# Patient Record
Sex: Male | Born: 1970
Health system: Southern US, Community
[De-identification: ages and names within clinical notes are randomized; demographics above are authoritative.]

## PROBLEM LIST (undated history)

## (undated) DIAGNOSIS — K900 Celiac disease: Secondary | ICD-10-CM

## (undated) DIAGNOSIS — E039 Hypothyroidism, unspecified: Secondary | ICD-10-CM

## (undated) DIAGNOSIS — E785 Hyperlipidemia, unspecified: Secondary | ICD-10-CM

## (undated) DIAGNOSIS — L130 Dermatitis herpetiformis: Secondary | ICD-10-CM

## (undated) DIAGNOSIS — J309 Allergic rhinitis, unspecified: Secondary | ICD-10-CM

## (undated) DIAGNOSIS — B351 Tinea unguium: Secondary | ICD-10-CM

## (undated) HISTORY — DX: Hypothyroidism, unspecified: E03.9

## (undated) HISTORY — DX: Allergic rhinitis, unspecified: J30.9

## (undated) HISTORY — DX: Hyperlipidemia, unspecified: E78.5

## (undated) HISTORY — DX: Tinea unguium: B35.1

## (undated) HISTORY — DX: Celiac disease: K90.0

## (undated) HISTORY — DX: Dermatitis herpetiformis: L13.0

---

## 2008-06-08 LAB — CONVERTED CEMR LAB
Albumin: 4.4 g/dL
BUN: 14 mg/dL
Calcium: 8.9 mg/dL
Chloride: 103 meq/L
Creatinine, Ser: 0.99 mg/dL
HCT: 47.7 %
Hemoglobin: 16.8 g/dL
Potassium: 4.1 meq/L
RDW: 12.2 %
Total Protein: 7.2 g/dL

## 2008-11-30 ENCOUNTER — Ambulatory Visit: Payer: Self-pay | Admitting: Internal Medicine

## 2008-11-30 DIAGNOSIS — J309 Allergic rhinitis, unspecified: Secondary | ICD-10-CM | POA: Insufficient documentation

## 2008-11-30 DIAGNOSIS — E785 Hyperlipidemia, unspecified: Secondary | ICD-10-CM

## 2008-11-30 DIAGNOSIS — E039 Hypothyroidism, unspecified: Secondary | ICD-10-CM | POA: Insufficient documentation

## 2008-11-30 DIAGNOSIS — L130 Dermatitis herpetiformis: Secondary | ICD-10-CM

## 2008-11-30 HISTORY — DX: Allergic rhinitis, unspecified: J30.9

## 2008-11-30 HISTORY — DX: Hyperlipidemia, unspecified: E78.5

## 2008-11-30 HISTORY — DX: Hypothyroidism, unspecified: E03.9

## 2008-11-30 HISTORY — DX: Dermatitis herpetiformis: L13.0

## 2008-11-30 LAB — CONVERTED CEMR LAB
Cholesterol: 214 mg/dL — ABNORMAL HIGH (ref 0–200)
TSH: 0.29 microintl units/mL — ABNORMAL LOW (ref 0.35–5.50)
Total CHOL/HDL Ratio: 5
Triglycerides: 188 mg/dL — ABNORMAL HIGH (ref 0.0–149.0)
VLDL: 37.6 mg/dL (ref 0.0–40.0)

## 2008-12-03 LAB — CONVERTED CEMR LAB: Chlamydia, Swab/Urine, PCR: NEGATIVE

## 2009-05-29 ENCOUNTER — Ambulatory Visit: Payer: Self-pay | Admitting: Internal Medicine

## 2009-05-30 LAB — CONVERTED CEMR LAB: TSH: 8.28 microintl units/mL — ABNORMAL HIGH (ref 0.35–5.50)

## 2009-09-25 ENCOUNTER — Ambulatory Visit: Payer: Self-pay | Admitting: Internal Medicine

## 2009-09-25 DIAGNOSIS — B351 Tinea unguium: Secondary | ICD-10-CM

## 2009-09-25 HISTORY — DX: Tinea unguium: B35.1

## 2009-09-25 LAB — CONVERTED CEMR LAB: TSH: 2.78 microintl units/mL (ref 0.35–5.50)

## 2009-10-31 ENCOUNTER — Telehealth: Payer: Self-pay | Admitting: Internal Medicine

## 2009-10-31 ENCOUNTER — Ambulatory Visit: Payer: Self-pay | Admitting: Internal Medicine

## 2009-10-31 LAB — CONVERTED CEMR LAB: TSH: 3.94 microintl units/mL (ref 0.35–5.50)

## 2010-01-24 ENCOUNTER — Ambulatory Visit: Payer: Self-pay | Admitting: Internal Medicine

## 2010-01-25 LAB — CONVERTED CEMR LAB
Albumin: 4.3 g/dL (ref 3.5–5.2)
Basophils Absolute: 0 10*3/uL (ref 0.0–0.1)
Basophils Relative: 0.4 % (ref 0.0–3.0)
Bilirubin Urine: NEGATIVE
Chloride: 107 meq/L (ref 96–112)
Cholesterol: 207 mg/dL — ABNORMAL HIGH (ref 0–200)
Creatinine, Ser: 0.9 mg/dL (ref 0.4–1.5)
Eosinophils Relative: 2.3 % (ref 0.0–5.0)
GFR calc non Af Amer: 101.9 mL/min (ref >60)
HDL: 46 mg/dL (ref >39.00)
Hemoglobin, Urine: NEGATIVE
Lymphocytes Relative: 30.8 % (ref 12.0–46.0)
MCV: 89 fL (ref 78.0–100.0)
Monocytes Absolute: 0.5 10*3/uL (ref 0.1–1.0)
Monocytes Relative: 9.4 % (ref 3.0–12.0)
Neutrophils Relative %: 57.1 % (ref 43.0–77.0)
PSA: 0.71 ng/mL (ref 0.10–4.00)
Platelets: 170 10*3/uL (ref 150.0–400.0)
RBC: 5.23 M/uL (ref 4.22–5.81)
TSH: 2.26 microintl units/mL (ref 0.35–5.50)
Total Protein, Urine: NEGATIVE mg/dL
Triglycerides: 175 mg/dL — ABNORMAL HIGH (ref 0.0–149.0)
Urine Glucose: NEGATIVE mg/dL
WBC: 5.4 10*3/uL (ref 4.5–10.5)

## 2010-01-31 ENCOUNTER — Ambulatory Visit: Payer: Self-pay | Admitting: Internal Medicine

## 2010-01-31 ENCOUNTER — Encounter: Payer: Self-pay | Admitting: Internal Medicine

## 2010-04-16 NOTE — Progress Notes (Signed)
Summary: TSH check?  Phone Note Call from Patient Call back at Home Phone [phone]   Caller: Patient Call For: Rowe Clack MD Summary of Call: pt feels excessively tired > 2 weeks, concerned not at right dose thyroid replacment - requests TSH check - Lucy, please enter order for TSH into [facility] (ICD9: 244.9) - thanks - i will then call pt with results once reviewed - Initial call taken by: Rowe Clack MD,  October 31, 2009 8:08 AM  Follow-up for Phone Call        Labs entered in [facility] Follow-up by: Tomma Lightning RMA,  October 31, 2009 8:16 AM  Additional Follow-up for Phone Call Additional follow up Details #1::        i notified pt - TSH norm Additional Follow-up by: Rowe Clack MD,  October 31, 2009 4:26 PM

## 2010-04-16 NOTE — Assessment & Plan Note (Signed)
Summary: 4-6 MTH FU  #  STC   Vital Signs:  Patient profile:   40 year old male Height:      70 inches (177.80 cm) Weight:      198.75 pounds (90.34 kg) BMI:     28.62 O2 Sat:      97 % on Room air Temp:     97.4 degrees F (36.33 degrees C) oral Pulse rate:   83 / minute BP sitting:   98 / 58  (left arm) Cuff size:   large  Vitals Entered By: Crissie Sickles, CMA (May 29, 2009 8:44 AM)  O2 Flow:  Room air CC: 4-6 mth f/u   Primary Care Provider:  Rowe Clack MD  CC:  4-6 mth f/u.  History of Present Illness: here for follow up thyroid  Allergies: No Known Drug Allergies  Past History:  Past Medical History: Reviewed history from 11/30/2008 and no changes required. hypothyroidism dyslipidemia  Review of Systems  The patient denies anorexia, fever, chest pain, and abdominal pain.    Physical Exam  General:  alert, well-developed, well-nourished, and cooperative to examination.    Neck:  supple, full ROM, no masses, no thyromegaly; no thyroid nodules or tenderness. no JVD or carotid bruits.   Lungs:  normal respiratory effort, no intercostal retractions or use of accessory muscles; normal breath sounds bilaterally - no crackles and no wheezes.    Heart:  normal rate, regular rhythm, no murmur, and no rub. BLE without edema. normal DP pulses and normal cap refill in all 4 extremities      Impression & Recommendations:  Problem # 1:  HYPOTHYROIDISM (ICD-244.9) has been off replacement meds 6 mos - will recheck now - further mgmt depending on labs - Labs Reviewed: TSH: 0.29 (11/30/2008)    Chol: 214 (11/30/2008)   HDL: 39.90 (11/30/2008)   LDL: 57 (06/08/2008)   TG: 188.0 (11/30/2008)  Orders: TLB-TSH (Thyroid Stimulating Hormone) (84443-TSH)  Complete Medication List: 1)  Dapsone 100 Mg Tabs (Dapsone) .... Take 1-3 by mouth once daily as needed 2)  Nasonex 50 Mcg/act Susp (Mometasone furoate) .Marland Kitchen.. 1 spray each nostril  every  morning  Patient Instructions: 1)  it was good to see you today.  2)  recheck TSH today - will call you with these results - 3)  further mgmt to depend on this results.Marland KitchenMarland Kitchen 4)  Please schedule a follow-up appointment annually, sooner if problems.

## 2010-04-16 NOTE — Assessment & Plan Note (Signed)
Summary: FU / NWS  #   Vital Signs:  Patient profile:   40 year old male Height:      70 inches (177.80 cm) Weight:      194.4 pounds (88.36 kg) O2 Sat:      97 % on Room air Temp:     98.3 degrees F (36.83 degrees C) oral Pulse rate:   88 / minute BP sitting:   112 / 70  (left arm) Cuff size:   large  Vitals Entered By: Tomma Lightning (September 25, 2009 8:51 AM)  O2 Flow:  Room air CC: follow-up visit Is Patient Diabetic? No Pain Assessment Patient in pain? no      Comments Requesting refills on synthroid   Primary Care Provider:  Rowe Clack MD  CC:  follow-up visit.  History of Present Illness: here for follow up thyroid  Clinical Review Panels:  Lipid Management   Cholesterol:  214 (11/30/2008)   LDL (bad choesterol):  128 (06/08/2008)   HDL (good cholesterol):  39.90 (11/30/2008)   Triglycerides:  285 (06/08/2008)  CBC   WBC:  6.5 (06/08/2008)   RBC:  5.38 (06/08/2008)   Hgb:  16.8 (06/08/2008)   Hct:  47.7 (06/08/2008)   Platelets:  172 (06/08/2008)   MCV  88.7 (06/08/2008)   RDW  12.2 (06/08/2008)  Complete Metabolic Panel   Glucose:  115 (06/08/2008)   Sodium:  137 (06/08/2008)   Potassium:  4.1 (06/08/2008)   Chloride:  103 (06/08/2008)   CO2:  26 (06/08/2008)   BUN:  14 (06/08/2008)   Creatinine:  0.99 (06/08/2008)   Albumin:  4.4 (06/08/2008)   Total Protein:  7.2 (06/08/2008)   Calcium:  8.9 (06/08/2008)   Total Bili:  0.8 (06/08/2008)   Alk Phos:  53 (06/08/2008)   SGPT (ALT):  43 (06/08/2008)   SGOT (AST):  27 (06/08/2008)   Current Medications (verified): 1)  Dapsone 100 Mg Tabs (Dapsone) .... Take 1-3 By Mouth Once Daily As Needed 2)  Nasonex 50 Mcg/act Susp (Mometasone Furoate) .Marland Kitchen.. 1 Spray Each Nostril  Every Morning 3)  Synthroid 50 Mcg Tabs (Levothyroxine Sodium) .Marland Kitchen.. 1 By Mouth Once Daily  Allergies (verified): No Known Drug Allergies  Past History:  Past Medical History: hypothyroidism dyslipidemia  Review of  Systems  The patient denies weight loss and abdominal pain.    Physical Exam  General:  alert, well-developed, well-nourished, and cooperative to examination.    Neck:  supple, full ROM, no masses, no thyromegaly; no thyroid nodules or tenderness. no JVD or carotid bruits.   Lungs:  normal respiratory effort, no intercostal retractions or use of accessory muscles; normal breath sounds bilaterally - no crackles and no wheezes.    Heart:  normal rate, regular rhythm, no murmur, and no rub. BLE without edema.  Skin:  4th/5th tonbails left foot with mild onco, nail lifting changes, no thickening Psych:  Oriented X3, memory intact for recent and remote, normally interactive, good eye contact, not anxious appearing, not depressed appearing, and not agitated.      Impression & Recommendations:  Problem # 1:  HYPOTHYROIDISM (ICD-244.9)  His updated medication list for this problem includes:    Synthroid 50 Mcg Tabs (Levothyroxine sodium) .Marland Kitchen... 1 by mouth once daily  Labs Reviewed: TSH: 8.28 (05/29/2009)    Chol: 214 (11/30/2008)   HDL: 39.90 (11/30/2008)   LDL: 57 (06/08/2008)   TG: 188.0 (11/30/2008)  Orders: TLB-TSH (Thyroid Stimulating Hormone) (84443-TSH)  Problem # 2:  ONYCHOMYCOSIS,  TOENAILS (ICD-110.1) rec herbal > systemic tx at this time - consider pennlac as needed   Complete Medication List: 1)  Dapsone 100 Mg Tabs (Dapsone) .... Take 1-3 by mouth once daily as needed 2)  Nasonex 50 Mcg/act Susp (Mometasone furoate) .Marland Kitchen.. 1 spray each nostril  every morning 3)  Synthroid 50 Mcg Tabs (Levothyroxine sodium) .Marland Kitchen.. 1 by mouth once daily  Patient Instructions: 1)  it was good to see you today.  2)  recheck TSH today - will call you with these results - 3)  further mgmt to depend on this results.Marland KitchenMarland Kitchen 4)  Please schedule a follow-up appointment sept-oct for physical and labs, sooner if problems.  Prescriptions: SYNTHROID 50 MCG TABS (LEVOTHYROXINE SODIUM) 1 by mouth once daily  #30  x 6   Entered by:   Tomma Lightning   Authorized by:   Rowe Clack MD   Signed by:   Tomma Lightning on 09/25/2009   Method used:   Electronically to        [facility] (retail)       [address]       [place], [place]  [address]       Ph: [phone]       Fax: [phone]   [place]:   [phone]

## 2010-04-16 NOTE — Assessment & Plan Note (Signed)
Summary: cpx/#/cd   Vital Signs:  Patient profile:   40 year old male Height:      70 inches (177.80 cm) Weight:      195.0 pounds (88.64 kg) O2 Sat:      97 % on Room air Temp:     98.3 degrees F (36.83 degrees C) oral Pulse rate:   67 / minute BP sitting:   110 / 82  (left arm) Cuff size:   large  Vitals Entered By: Tomma Lightning RMA (January 31, 2010 8:12 AM)  O2 Flow:  Room air CC: CPX Is Patient Diabetic? No Pain Assessment Patient in pain? no        Primary Care Provider:  Rowe Clack MD  CC:  CPX.  History of Present Illness: patient is here today for annual physical. Patient feels well and has no complaints.   reviewed other med issues: allg rhinitis - inc sinus and right ear fullness with weather change using nasonex w/o complete relief of symptoms  no fever or HA  hypothyroid - reports compliance with ongoing medical treatment and no changes in medication dose or frequency. denies adverse side effects related to current therapy.   Preventive Screening-Counseling & Management  Alcohol-Tobacco     Alcohol drinks/day: <1     Alcohol Counseling: not indicated; use of alcohol is not excessive or problematic     Smoking Status: never     Tobacco Counseling: not indicated; no tobacco use  Caffeine-Diet-Exercise     Nutrition Referrals: no     Does Patient Exercise: yes     Exercise Counseling: not indicated; exercise is adequate     Depression Counseling: not indicated; screening negative for depression  Safety-Violence-Falls     Seat Belt Counseling: not indicated; patient wears seat belts     Helmet Counseling: not indicated; patient wears helmet when riding bicycle/motocycle     Firearm Counseling: not applicable     Smoke Detector Counseling: no     Violence Counseling: not indicated; no violence risk noted     Fall Risk Counseling: not indicated; no significant falls noted  Clinical Review Panels:  Prevention   Last PSA:  0.71  (01/24/2010)  Immunizations   Last Tetanus Booster:  Tdap (11/30/2008)  Lipid Management   Cholesterol:  207 (01/24/2010)   LDL (bad choesterol):  128 (06/08/2008)   HDL (good cholesterol):  46.00 (01/24/2010)   Triglycerides:  285 (06/08/2008)  CBC   WBC:  5.4 (01/24/2010)   RBC:  5.23 (01/24/2010)   Hgb:  16.2 (01/24/2010)   Hct:  46.5 (01/24/2010)   Platelets:  170.0 (01/24/2010)   MCV  89.0 (01/24/2010)   MCHC  34.7 (01/24/2010)   RDW  12.6 (01/24/2010)   PMN:  57.1 (01/24/2010)   Lymphs:  30.8 (01/24/2010)   Monos:  9.4 (01/24/2010)   Eosinophils:  2.3 (01/24/2010)   Basophil:  0.4 (01/24/2010)  Complete Metabolic Panel   Glucose:  101 (01/24/2010)   Sodium:  141 (01/24/2010)   Potassium:  4.5 (01/24/2010)   Chloride:  107 (01/24/2010)   CO2:  26 (01/24/2010)   BUN:  15 (01/24/2010)   Creatinine:  0.9 (01/24/2010)   Albumin:  4.3 (01/24/2010)   Total Protein:  6.6 (01/24/2010)   Calcium:  9.2 (01/24/2010)   Total Bili:  1.0 (01/24/2010)   Alk Phos:  52 (01/24/2010)   SGPT (ALT):  37 (01/24/2010)   SGOT (AST):  21 (01/24/2010)   Current Medications (verified): 1)  Dapsone 100  Mg Tabs (Dapsone) .... Take 1-3 By Mouth Once Daily As Needed 2)  Nasonex 50 Mcg/act Susp (Mometasone Furoate) .Marland Kitchen.. 1 Spray Each Nostril  Every Morning 3)  Synthroid 50 Mcg Tabs (Levothyroxine Sodium) .Marland Kitchen.. 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 I have reviewed all other systems and they were negative.   Physical Exam  General:  alert, well-developed, well-nourished, and cooperative to examination.    Head:  Normocephalic and atraumatic without obvious abnormalities. No apparent alopecia or balding. Eyes:  vision grossly intact; pupils equal, round and reactive to light.  conjunctiva and lids normal.    Ears:  normal pinnae bilaterally, without erythema, swelling, or tenderness to palpation. TMs hazy, without effusion, or cerumen impaction. Hearing grossly normal bilaterally  Mouth:  teeth and gums in good repair; mucous membranes moist, without lesions or ulcers. oropharynx clear without exudate, no erythema.  Lungs:  normal respiratory effort, no intercostal retractions or use of accessory muscles; normal breath sounds bilaterally - no crackles and no wheezes.    Heart:  normal rate, regular rhythm, no murmur, and no rub. BLE without edema. normal DP pulses and normal cap refill in all 4 extremities    Abdomen:  soft, non-tender, normal bowel sounds, no distention; no masses and no appreciable hepatomegaly or splenomegaly.   Genitalia:  defer Msk:  No deformity or scoliosis noted of thoracic or lumbar spine.   Neurologic:  alert & oriented X3 and cranial nerves II-XII symetrically intact.  strength normal in all extremities, sensation intact to light touch, and gait normal. speech fluent without dysarthria or aphasia; follows commands with good comprehension.  Skin:  no rashes, vesicles, ulcers, or erythema. No nodules or irregularity to palpation.  Psych:  Oriented X3, memory intact for recent and remote, normally interactive, good eye contact, not anxious appearing, not depressed appearing, and not agitated.      Impression & Recommendations:  Problem # 1:  PREVENTIVE HEALTH CARE (ICD-V70.0) Patient has been counseled on age-appropriate routine health concerns for screening and  prevention. These are reviewed and up-to-date. Immunizations are up-to-date or declined. Labs and ECG reviewed.  Orders: EKG w/ Interpretation (93000)  Problem # 2:  HYPOTHYROIDISM (ICD-244.9)  His updated medication list for this problem includes:    Synthroid 50 Mcg Tabs (Levothyroxine sodium) .Marland Kitchen... 1 by mouth once daily  Labs Reviewed: TSH: 2.26 (01/24/2010)    Chol: 207 (01/24/2010)   HDL: 46.00 (01/24/2010)   LDL: 57 (06/08/2008)   TG: 175.0 (01/24/2010)  Problem # 3:  ALLERGIC RHINITIS CAUSE UNSPECIFIED (ICD-477.9)  His updated medication list for this problem includes:    Veramyst 27.5 Mcg/spray Susp (Fluticasone furoate) .Marland Kitchen... 1 spray each nostril  every morning  Discussed use of allergy medications and environmental measures.   Complete Medication List: 1)  Dapsone 100 Mg Tabs (Dapsone) .... Take 1-3 by mouth once daily as needed 2)  Veramyst 27.5 Mcg/spray Susp (Fluticasone furoate) .Marland Kitchen.. 1 spray  each nostril  every morning 3)  Synthroid 50 Mcg Tabs (Levothyroxine sodium) .Marland Kitchen.. 1 by mouth once daily  Patient Instructions: 1)  it was good to see you today.  2)  exam and labs and EKG look great 3)  change nasonex to veramyst - your prescription has been electronically submitted to your pharmacy. Please take as directed. Contact our office if you believe you're having problems with the medication(s).  4)  Please schedule a follow-up appointment in 6 months for TSH, call sooner if problems.  Prescriptions: VERAMYST 27.5 MCG/SPRAY SUSP (FLUTICASONE FUROATE) 1 spray each nostril  every morning  #1 x 3   Entered and Authorized by:   Rowe Clack MD   Signed by:   Rowe Clack MD on 01/31/2010   Method used:   Electronically to        [facility]       [address]       [place], [place]  [address]       Ph: [phone]       Fax: [phone]   [facility]:   [id number]    Orders Added: 1)  EKG w/ Interpretation  [93000] 2)  Est. Patient 40-64 years [50354]

## 2010-06-11 ENCOUNTER — Other Ambulatory Visit: Payer: Self-pay | Admitting: Internal Medicine

## 2010-09-14 ENCOUNTER — Encounter: Payer: Self-pay | Admitting: Internal Medicine

## 2011-01-07 ENCOUNTER — Other Ambulatory Visit: Payer: Self-pay | Admitting: Internal Medicine

## 2011-08-29 ENCOUNTER — Other Ambulatory Visit: Payer: Self-pay | Admitting: Internal Medicine

## 2011-09-01 MED ORDER — FLUTICASONE FUROATE 27.5 MCG/SPRAY NA SUSP: 1.0000 | NASAL | Status: DC

## 2011-11-23 ENCOUNTER — Other Ambulatory Visit: Payer: Self-pay | Admitting: Internal Medicine

## 2015-04-05 DIAGNOSIS — E039 Hypothyroidism, unspecified: Secondary | ICD-10-CM | POA: Diagnosis not present

## 2015-05-31 DIAGNOSIS — E039 Hypothyroidism, unspecified: Secondary | ICD-10-CM | POA: Diagnosis not present

## 2015-05-31 MED FILL — SYNTHROID 137 MCG TABLET: 137 | 90 days supply | Qty: 90 | Fill #0

## 2015-09-06 MED FILL — SYNTHROID 137 MCG TABLET: 137 | 90 days supply | Qty: 90 | Fill #0

## 2015-12-17 MED FILL — SYNTHROID 137 MCG TABLET: 137 | 90 days supply | Qty: 90 | Fill #1

## 2016-03-18 MED FILL — SYNTHROID 137 MCG TABLET: 137 | 90 days supply | Qty: 90 | Fill #2

## 2016-06-25 MED FILL — SYNTHROID 137 MCG TABLET: 137 | 90 days supply | Qty: 90 | Fill #0

## 2016-08-07 DIAGNOSIS — L130 Dermatitis herpetiformis: Secondary | ICD-10-CM | POA: Diagnosis not present

## 2016-08-07 DIAGNOSIS — E039 Hypothyroidism, unspecified: Secondary | ICD-10-CM | POA: Diagnosis not present

## 2016-08-07 DIAGNOSIS — Z6828 Body mass index (BMI) 28.0-28.9, adult: Secondary | ICD-10-CM | POA: Diagnosis not present

## 2016-08-07 DIAGNOSIS — E663 Overweight: Secondary | ICD-10-CM | POA: Diagnosis not present

## 2016-09-25 MED FILL — SYNTHROID 137 MCG TABLET: 137 | 90 days supply | Qty: 90 | Fill #1

## 2016-12-31 MED FILL — SYNTHROID 137 MCG TABLET: 137 | 90 days supply | Qty: 90 | Fill #0

## 2017-04-16 MED FILL — SYNTHROID 137 MCG TABLET: 137 | 90 days supply | Qty: 90 | Fill #1

## 2017-06-14 ENCOUNTER — Encounter (HOSPITAL_BASED_OUTPATIENT_CLINIC_OR_DEPARTMENT_OTHER): Payer: Self-pay | Admitting: Respiratory Therapy

## 2017-06-14 ENCOUNTER — Emergency Department (HOSPITAL_BASED_OUTPATIENT_CLINIC_OR_DEPARTMENT_OTHER)
Admission: EM | Admit: 2017-06-14 | Discharge: 2017-06-14 | Disposition: A | Discharge status: Home / Self Care | Payer: 59 | Attending: Emergency Medicine | Admitting: Emergency Medicine

## 2017-06-14 ENCOUNTER — Other Ambulatory Visit: Payer: Self-pay

## 2017-06-14 ENCOUNTER — Emergency Department (HOSPITAL_BASED_OUTPATIENT_CLINIC_OR_DEPARTMENT_OTHER): Payer: 59

## 2017-06-14 DIAGNOSIS — R509 Fever, unspecified: Secondary | ICD-10-CM | POA: Insufficient documentation

## 2017-06-14 DIAGNOSIS — Z79899 Other long term (current) drug therapy: Secondary | ICD-10-CM | POA: Insufficient documentation

## 2017-06-14 DIAGNOSIS — J3489 Other specified disorders of nose and nasal sinuses: Secondary | ICD-10-CM | POA: Insufficient documentation

## 2017-06-14 DIAGNOSIS — R059 Cough, unspecified: Secondary | ICD-10-CM

## 2017-06-14 DIAGNOSIS — R05 Cough: Secondary | ICD-10-CM | POA: Diagnosis not present

## 2017-06-14 DIAGNOSIS — E039 Hypothyroidism, unspecified: Secondary | ICD-10-CM | POA: Diagnosis not present

## 2017-06-14 DIAGNOSIS — R0981 Nasal congestion: Secondary | ICD-10-CM | POA: Diagnosis not present

## 2017-06-14 NOTE — ED Notes (Signed)
ED Provider at bedside. 

## 2017-06-14 NOTE — ED Provider Notes (Signed)
[facility] EMERGENCY DEPARTMENT Provider Note   CSN: [id number] Arrival date & time: 06/14/17  1022     History   Chief Complaint Chief Complaint  Patient presents with  . Cough    HPI Chad Bennett is a 47 y.o. male.  The history is provided by the patient and medical records.  URI   This is a new problem. The current episode started more than 1 week ago. The problem has not changed since onset.The fever has been present for less than 1 day. Associated symptoms include congestion, rhinorrhea and cough. Pertinent negatives include no chest pain, no abdominal pain, no diarrhea, no nausea, no vomiting, no dysuria, no headaches, no sneezing, no neck pain, no rash and no wheezing. He has tried NSAIDs for the symptoms. The treatment provided no relief.    Past Medical History:  Diagnosis Date  . ALLERGIC RHINITIS CAUSE UNSPECIFIED 11/30/2008  . 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 DYSLIPIDEMIA 11/30/2008  . HYPOTHYROIDISM 11/30/2008  . ONYCHOMYCOSIS, TOENAILS 09/25/2009    Patient Active Problem List   Diagnosis Date Noted  . ONYCHOMYCOSIS, TOENAILS 09/25/2009  . HYPOTHYROIDISM 11/30/2008  . DYSLIPIDEMIA 11/30/2008  . ALLERGIC RHINITIS CAUSE UNSPECIFIED 11/30/2008  . 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 No pertinent surgical history.      Home Medications    Prior to Admission medications   Medication Sig Start Date End Date Taking? Authorizing Provider  dapsone 100 MG tablet Take 100 mg by mouth daily.      [provider]  fluticasone (VERAMYST) 27.5 MCG/SPRAY nasal spray Place 1 spray into the nose every morning. 09/01/11   Rowe Clack, MD  levothyroxine (SYNTHROID, LEVOTHROID) 50 MCG tablet TAKE ONE TABLET BY MOUTH ONE TIME DAILY 11/23/11   Rowe Clack, MD    Family History Family History  Problem Relation Age of Onset  . Hypertension Mother   . Hypertension Other        grandparent  . Diabetes Other          grandparent  . Hypertension Other        Hx CABG    Social History Social History   Tobacco Use  . Smoking status: Never Smoker  . Smokeless tobacco: Never Used  . Tobacco comment: Divorced- but in committed relationship since 2008  Substance Use Topics  . Alcohol use: Yes    Comment: occassional beer  . Drug use: No     Allergies   Patient has no known allergies.   Review of Systems Review of Systems  Constitutional: Positive for chills and fever. Negative for diaphoresis and fatigue.  HENT: Positive for congestion and rhinorrhea. Negative for sneezing.   Respiratory: Positive for cough. Negative for chest tightness, shortness of breath, wheezing and stridor.   Cardiovascular: Negative for chest pain and palpitations.  Gastrointestinal: Negative for abdominal pain, constipation, diarrhea, nausea and vomiting.  Genitourinary: Negative for dysuria, enuresis and flank pain.  Musculoskeletal: Positive for myalgias. Negative for back pain, neck pain and neck stiffness.  Skin: Negative for rash.  Neurological: Negative for light-headedness, numbness and headaches.  Psychiatric/Behavioral: Negative for agitation.  All other systems reviewed and are negative.    Physical Exam Updated Vital Signs BP 120/69 (BP Location: Left Arm)   Pulse (!) 105   Temp 99.9 F (37.7 C) (Oral)   Resp 20   Ht 5' 10"  (1.778 m)   Wt 90.7 kg (200 lb)   SpO2 97%   BMI 28.70  kg/m   Physical Exam  Constitutional: He appears well-developed and well-nourished. No distress.  HENT:  Head: Normocephalic.  Nose: Nose normal.  Mouth/Throat: Oropharynx is clear and moist. No oropharyngeal exudate.  Eyes: Conjunctivae are normal.  Neck: Normal range of motion. Neck supple.  Cardiovascular: Normal rate.  No murmur heard. Pulmonary/Chest: Effort normal and breath sounds normal. No stridor. No respiratory distress. He has no wheezes. He has no rales. He exhibits no tenderness.  Abdominal:  Soft. He exhibits no distension. There is no tenderness.  Musculoskeletal: He exhibits no edema or tenderness.  Neurological: He is alert.  Skin: Skin is warm. Capillary refill takes less than 2 seconds. He is not diaphoretic. No erythema. No pallor.  Psychiatric: He has a normal mood and affect.  Nursing note and vitals reviewed.    ED Treatments / Results  Labs (all labs ordered are listed, but only abnormal results are displayed) Labs Reviewed - No data to display  EKG None  Radiology Dg Chest 2 View  Result Date: 06/14/2017 CLINICAL DATA:  Cough, congestion and fevers. EXAM: CHEST - 2 VIEW COMPARISON:  None. FINDINGS: The heart size and mediastinal contours are within normal limits. Both lungs are clear. The visualized skeletal structures are unremarkable. IMPRESSION: No active cardiopulmonary disease. Electronically Signed   By: Kerby Moors M.D.   On: 06/14/2017 10:52    Procedures Procedures (including critical care time)  Medications Ordered in ED Medications - No data to display   Initial Impression / Assessment and Plan / ED Course  I have reviewed the triage vital signs and the nursing notes.  Pertinent labs & imaging results that were available during my care of the patient were reviewed by me and considered in my medical decision making (see chart for details).     Chad Bennett is a 47 y.o. male with no significant past medical history who presents with cough, rhinorrhea, and congestion for 1 week and a 2-day history of subjective fevers.  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  He reports over the last week he has had URI symptoms including rhinorrhea, congestion, cough.  He reports that he thought it was viral however it continued over the weekend and he developed a subjective fever last night.  On arrival, patient had an oral temp of 99.9, suspect real fever.   Patient reports no chest pain, palpitations, or abdominal pain.  He denies nausea, vomiting, conservation, diarrhea, or dysuria.  He overall appears well but does have some myalgias.  His primary concern today is to rule out pneumonia.  On exam, lungs are clear.  Chest is nontender.  Abdomen nontender.  Patient had no neck lymph nodes and no neck soreness.  Patient had  normal oxygen saturation on room air.  With the patient's known exposures to bacterial pneumonias and his symptoms continue for 1 week, decision made to obtain x-ray.    Chest x-ray shows no evidence of pneumonia.  Suspect viral infection continuing causing his symptoms.  Patient did not want to get a flu test as he is outside of window for Tamiflu and he agrees this is likely viral.  Patient will stay hydrated and follow-up with his PCP in several days.  Patient had improved vital signs while waiting in the emergency department and was feeling well.  Patient had no other questions or concerns and understood return precautions.  Patient discharged in good  condition.   Final Clinical Impressions(s) / ED Diagnoses   Final diagnoses:  Cough    ED Discharge Orders    None     Clinical Impression: 1. Cough     Disposition: Discharge  Condition: Good  I have discussed the results, Dx and Tx plan with the pt(& family if present). He/she/they expressed understanding and agree(s) with the plan. Discharge instructions discussed at great length. Strict return precautions discussed and pt &/or family have verbalized understanding of the instructions. No further questions at time of discharge.    New Prescriptions   No medications on file    Follow Up: [facility] [address] [id number] mc [address] [place] [phone]       Rey Dansby, Gwenyth Allegra, MD 06/14/17 [phone]

## 2017-06-14 NOTE — ED Triage Notes (Signed)
Cough x 1 week, states he is concerned for pneumonia. Subjective fever last night also.

## 2017-06-14 NOTE — ED Notes (Signed)
Patient transported to X-ray 

## 2017-06-14 NOTE — Discharge Instructions (Signed)
Your x-rays today did not show evidence of pneumonia.  Given your fevers, cough, congestion, and cold symptoms, we suspect you have a viral upper respiratory infection.  Please rest and stay hydrated.  Please follow-up with your primary care physician.  If any symptoms change or worsen, please return to the nearest emergency department for reevaluation.

## 2017-08-04 MED FILL — SYNTHROID 137 MCG TABLET: 137 | 90 days supply | Qty: 90 | Fill #0

## 2017-10-12 ENCOUNTER — Telehealth: Payer: Self-pay | Admitting: Cardiology

## 2017-10-12 ENCOUNTER — Other Ambulatory Visit: Payer: Self-pay | Admitting: *Deleted

## 2017-10-12 DIAGNOSIS — R0602 Shortness of breath: Secondary | ICD-10-CM

## 2017-10-12 NOTE — Telephone Encounter (Signed)
-----   Message from Minus Breeding, MD sent at 10/12/2017 11:08 AM EDT ----- Can you order a calcium score on Dr. Thereasa Solo and call him with instructions.  [phone]).  SOB.   Thanks.

## 2017-10-12 NOTE — Telephone Encounter (Signed)
Patient has been scheduled for test on 10/21/17. Scheduled by Freddi Starr.

## 2017-10-12 NOTE — Telephone Encounter (Signed)
Coronary calcium score test has been ordered.  This test is done at [address]. This is $150 out of pocket  LMTCB  Coronary CalciumScan A coronary calcium scan is an imaging test used to look for deposits of calcium and other fatty materials (plaques) in the inner lining of the blood vessels of the heart (coronary arteries). These deposits of calcium and plaques can partly clog and narrow the coronary arteries without producing any symptoms or warning signs. This puts a person at risk for a heart attack. This test can detect these deposits before symptoms develop. Tell a health care provider about:  Any allergies you have.  All medicines you are taking, including vitamins, herbs, eye drops, creams, and over-the-counter medicines.  Any problems you or family members have had with anesthetic medicines.  Any blood disorders you have.  Any surgeries you have had.  Any medical conditions you have.  Whether you are pregnant or may be pregnant. What are the risks? Generally, this is a safe procedure. However, problems may occur, including:  Harm to a pregnant woman and her unborn baby. This test involves the use of radiation. Radiation exposure can be dangerous to a pregnant woman and her unborn baby. If you are pregnant, you generally should not have this procedure done.  Slight increase in the risk of cancer. This is because of the radiation involved in the test. What happens before the procedure? No preparation is needed for this procedure. What happens during the procedure?  You will undress and remove any jewelry around your neck or chest.  You will put on a hospital gown.  Sticky electrodes will be placed on your chest. The electrodes will be connected to an electrocardiogram (ECG) machine to record a tracing of the electrical activity of your heart.  A CT scanner will take pictures of your heart. During this time, you will be asked to lie still and hold  your breath for 2-3 seconds while a picture of your heart is being taken. The procedure may vary among health care providers and hospitals. What happens after the procedure?  You can get dressed.  You can return to your normal activities.  It is up to you to get the results of your test. Ask your health care provider, or the department that is doing the test, when your results will be ready. Summary  A coronary calcium scan is an imaging test used to look for deposits of calcium and other fatty materials (plaques) in the inner lining of the blood vessels of the heart (coronary arteries).  Generally, this is a safe procedure. Tell your health care provider if you are pregnant or may be pregnant.  No preparation is needed for this procedure.  A CT scanner will take pictures of your heart.  You can return to your normal activities after the scan is done. This information is not intended to replace advice given to you by your health care provider. Make sure you discuss any questions you have with your health care provider. Document Released: 08/30/2007 Document Revised: 01/21/2016 Document Reviewed: 01/21/2016 Elsevier Interactive Patient Education  2017 [employer].

## 2017-10-12 NOTE — Telephone Encounter (Signed)
Routed to Dr. Percival Spanish as Juluis Rainier

## 2017-10-21 ENCOUNTER — Ambulatory Visit (INDEPENDENT_AMBULATORY_CARE_PROVIDER_SITE_OTHER)
Admission: RE | Admit: 2017-10-21 | Discharge: 2017-10-21 | Disposition: A | Discharge status: Home / Self Care | Payer: Self-pay | Source: Ambulatory Visit | Attending: Cardiology | Admitting: Cardiology

## 2017-10-21 DIAGNOSIS — R0602 Shortness of breath: Secondary | ICD-10-CM

## 2017-11-17 MED FILL — SYNTHROID 137 MCG TABLET: 137 | 90 days supply | Qty: 90 | Fill #1

## 2018-01-15 DIAGNOSIS — H5203 Hypermetropia, bilateral: Secondary | ICD-10-CM | POA: Diagnosis not present

## 2018-01-15 DIAGNOSIS — H524 Presbyopia: Secondary | ICD-10-CM | POA: Diagnosis not present

## 2018-02-18 MED FILL — AZITHROMYCIN 500 MG TABLET: 500 | 7 days supply | Qty: 7 | Fill #0

## 2018-02-18 MED FILL — MONTELUKAST SOD 10 MG TAB: 10 | 30 days supply | Qty: 30 | Fill #0

## 2018-02-18 MED FILL — SYNTHROID 137 MCG TABLET: 137 | 90 days supply | Qty: 90 | Fill #2

## 2018-06-02 MED FILL — SYNTHROID 137 MCG TABLET: 137 | 90 days supply | Qty: 90 | Fill #0

## 2018-06-04 MED FILL — HYDROXYCHLOROQUINE SULFATE: 200 | 5 days supply | Qty: 14 | Fill #0

## 2018-07-15 DIAGNOSIS — E039 Hypothyroidism, unspecified: Secondary | ICD-10-CM | POA: Diagnosis not present

## 2018-07-15 DIAGNOSIS — L130 Dermatitis herpetiformis: Secondary | ICD-10-CM | POA: Diagnosis not present

## 2018-07-15 DIAGNOSIS — E663 Overweight: Secondary | ICD-10-CM | POA: Diagnosis not present

## 2018-07-23 DIAGNOSIS — E039 Hypothyroidism, unspecified: Secondary | ICD-10-CM | POA: Diagnosis not present

## 2018-09-08 MED FILL — SYNTHROID 137 MCG TABLET: 137 | 90 days supply | Qty: 90 | Fill #1

## 2018-11-29 ENCOUNTER — Other Ambulatory Visit: Payer: Self-pay | Admitting: *Deleted

## 2018-11-29 DIAGNOSIS — R6889 Other general symptoms and signs: Secondary | ICD-10-CM | POA: Diagnosis not present

## 2018-11-29 DIAGNOSIS — Z20822 Contact with and (suspected) exposure to covid-19: Secondary | ICD-10-CM

## 2018-12-01 LAB — NOVEL CORONAVIRUS, NAA: SARS-CoV-2, NAA: NOT DETECTED

## 2018-12-15 MED FILL — SYNTHROID 137 MCG TABLET: 137 | 90 days supply | Qty: 90 | Fill #2

## 2019-01-10 ENCOUNTER — Encounter: Payer: Self-pay | Admitting: Internal Medicine

## 2019-01-10 ENCOUNTER — Telehealth: Payer: Self-pay | Admitting: Internal Medicine

## 2019-01-10 DIAGNOSIS — K921 Melena: Secondary | ICD-10-CM

## 2019-01-10 DIAGNOSIS — Z1159 Encounter for screening for other viral diseases: Secondary | ICD-10-CM

## 2019-01-10 DIAGNOSIS — R109 Unspecified abdominal pain: Secondary | ICD-10-CM

## 2019-01-10 NOTE — Telephone Encounter (Signed)
Having abdominal pain, hematochezia   I have told Dr. 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

## 2019-01-10 NOTE — Telephone Encounter (Signed)
He is aware that he needs to go for his testing at 0900 on Friday, October 30

## 2019-01-10 NOTE — Telephone Encounter (Signed)
Make it telehealth phone call

## 2019-01-10 NOTE — Telephone Encounter (Signed)
Yes he will need to COVID screen this Thursday at [address].  He will need to go at a particular time.  If you tell me when he is available that am or pm I will put him on and let you know.

## 2019-01-10 NOTE — Telephone Encounter (Signed)
Patient has been scheduled

## 2019-01-12 ENCOUNTER — Encounter: Payer: Self-pay | Admitting: Internal Medicine

## 2019-01-12 ENCOUNTER — Ambulatory Visit (INDEPENDENT_AMBULATORY_CARE_PROVIDER_SITE_OTHER): Payer: 59 | Admitting: Internal Medicine

## 2019-01-12 DIAGNOSIS — K921 Melena: Secondary | ICD-10-CM | POA: Diagnosis not present

## 2019-01-12 DIAGNOSIS — K900 Celiac disease: Secondary | ICD-10-CM

## 2019-01-12 DIAGNOSIS — R194 Change in bowel habit: Secondary | ICD-10-CM

## 2019-01-12 DIAGNOSIS — K4090 Unilateral inguinal hernia, without obstruction or gangrene, not specified as recurrent: Secondary | ICD-10-CM | POA: Diagnosis not present

## 2019-01-12 DIAGNOSIS — R1084 Generalized abdominal pain: Secondary | ICD-10-CM

## 2019-01-13 ENCOUNTER — Encounter: Payer: Self-pay | Admitting: Internal Medicine

## 2019-01-13 DIAGNOSIS — K4090 Unilateral inguinal hernia, without obstruction or gangrene, not specified as recurrent: Secondary | ICD-10-CM | POA: Insufficient documentation

## 2019-01-13 DIAGNOSIS — K900 Celiac disease: Secondary | ICD-10-CM | POA: Insufficient documentation

## 2019-01-13 NOTE — Progress Notes (Signed)
    TELEHEALTH ENCOUNTER IN SETTING OF COVID-19 PANDEMIC - REQUESTED BY PATIENT SERVICE PROVIDED BY TELEMEDECINE - TYPE: telephone PATIENT LOCATION:[place] PATIENT HAS CONSENTED TO TELEHEALTH VISIT PROVIDER LOCATION: OFFICE PARTICIPANTS OTHER THAN PATIENT:none TIME SPENT ON CALL:11 mins    Cherene Altes, Dr. 48 y.o. 02-Mar-1971 [id number]  Assessment & Plan:   Encounter Diagnoses  Name Primary?  . Hematochezia Yes  . Generalized abdominal pain   . Change in bowel habits   . Celiac disease   . Inguinal hernia of right side without obstruction or gangrene?     Evaluate with EGD and colonoscopy. Definitely check celiac status w/ duodenal bxs  ? Active celiac disease or complications of it - ? IBD - ? 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 Epigastric burning - Tums helps a bit - 1-2 hrs later has lower abdominal cramps and bloating. 2-3 hrs post cibal feels bad.  newepisodic frequent loose stools and then may be almost constipated at times eSome occasional bright red per rectum with harder stools but in bpast 2 weeks some hematochezia - maroon stools.  Weight loss by intent. 200# down to 185 and back up a bit.  Gluten-free almost always  GI ROS o/w neg except ? R ing hernia   No Known Allergies No outpatient medications have been marked as taking for the 01/12/19 encounter (Office Visit) with Gatha Mayer, MD.   Past Medical History:  Diagnosis Date  . ALLERGIC RHINITIS CAUSE UNSPECIFIED 11/30/2008  . Celiac disease   . 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 DYSLIPIDEMIA 11/30/2008  . HYPOTHYROIDISM 11/30/2008  . ONYCHOMYCOSIS, TOENAILS 09/25/2009   No past surgical history on file. 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   Review of Systems As above

## 2019-01-13 NOTE — Patient Instructions (Addendum)
You are set up for an upper endoscopy and colonoscopy on Monday 01/17/19 at 3 PM (arrive 2 PM)

## 2019-01-14 DIAGNOSIS — Z1159 Encounter for screening for other viral diseases: Secondary | ICD-10-CM | POA: Diagnosis not present

## 2019-01-14 LAB — SARS CORONAVIRUS 2 (TAT 6-24 HRS): SARS Coronavirus 2: NEGATIVE

## 2019-01-17 ENCOUNTER — Encounter: Payer: Self-pay | Admitting: Internal Medicine

## 2019-01-17 ENCOUNTER — Other Ambulatory Visit: Payer: Self-pay

## 2019-01-17 ENCOUNTER — Ambulatory Visit (AMBULATORY_SURGERY_CENTER): Payer: 59 | Admitting: Internal Medicine

## 2019-01-17 VITALS — BP 112/64 | HR 88 | Temp 98.5°F | Resp 20 | Ht 70.0 in | Wt 200.0 lb

## 2019-01-17 DIAGNOSIS — K2980 Duodenitis without bleeding: Secondary | ICD-10-CM | POA: Diagnosis not present

## 2019-01-17 DIAGNOSIS — R194 Change in bowel habit: Secondary | ICD-10-CM

## 2019-01-17 DIAGNOSIS — K5289 Other specified noninfective gastroenteritis and colitis: Secondary | ICD-10-CM

## 2019-01-17 DIAGNOSIS — K5730 Diverticulosis of large intestine without perforation or abscess without bleeding: Secondary | ICD-10-CM | POA: Diagnosis not present

## 2019-01-17 DIAGNOSIS — K2981 Duodenitis with bleeding: Secondary | ICD-10-CM | POA: Diagnosis not present

## 2019-01-17 DIAGNOSIS — E039 Hypothyroidism, unspecified: Secondary | ICD-10-CM | POA: Diagnosis not present

## 2019-01-17 DIAGNOSIS — K921 Melena: Secondary | ICD-10-CM

## 2019-01-17 DIAGNOSIS — R109 Unspecified abdominal pain: Secondary | ICD-10-CM | POA: Diagnosis not present

## 2019-01-17 DIAGNOSIS — R1084 Generalized abdominal pain: Secondary | ICD-10-CM

## 2019-01-17 DIAGNOSIS — K2950 Unspecified chronic gastritis without bleeding: Secondary | ICD-10-CM | POA: Diagnosis not present

## 2019-01-17 DIAGNOSIS — K900 Celiac disease: Secondary | ICD-10-CM

## 2019-01-17 DIAGNOSIS — K3189 Other diseases of stomach and duodenum: Secondary | ICD-10-CM | POA: Diagnosis not present

## 2019-01-17 DIAGNOSIS — K6389 Other specified diseases of intestine: Secondary | ICD-10-CM | POA: Diagnosis not present

## 2019-01-17 HISTORY — PX: COLONOSCOPY: SHX174

## 2019-01-17 HISTORY — PX: UPPER GASTROINTESTINAL ENDOSCOPY: SHX188

## 2019-01-17 MED ORDER — SODIUM CHLORIDE 0.9 % IV SOLN: 500.0000 mL | Freq: Once | INTRAVENOUS | Status: DC

## 2019-01-17 NOTE — Progress Notes (Signed)
Report given to PACU, vss 

## 2019-01-17 NOTE — Progress Notes (Signed)
Pt's states no medical or surgical changes since previsit or office visit.  Temp YF Vitals CW

## 2019-01-17 NOTE — Op Note (Signed)
[facility] Patient Name: Chad Bennett Procedure Date: 01/17/2019 3:01 PM MRN: [id number] Endoscopist: Gatha Mayer , MD Age: 48 Referring MD:  Date of Birth: 09-30-1970 Gender: Male Account #: [id number] Procedure:                Upper GI endoscopy Indications:              Generalized abdominal pain, Hematochezia, Celiac                            disease Medicines:                Propofol per Anesthesia, Monitored Anesthesia Care Procedure:                Pre-Anesthesia Assessment:                           - Prior to the procedure, a History and Physical                            was performed, and patient medications and                            allergies were reviewed. The patient's tolerance of                            previous anesthesia was also reviewed. The risks                            and benefits of the procedure and the sedation                            options and risks were discussed with the patient.                            All questions were answered, and informed consent                            was obtained. Prior Anticoagulants: The patient has                            taken no previous anticoagulant or antiplatelet                            agents. ASA Grade Assessment: II - A patient with                            mild systemic disease. After reviewing the risks                            and benefits, the patient was deemed in                            satisfactory condition to undergo the procedure.  After obtaining informed consent, the endoscope was                            passed under direct vision. Throughout the                            procedure, the patient's blood pressure, pulse, and                            oxygen saturations were monitored continuously. The                            Endoscope was introduced through the mouth, and                            advanced to the second  part of duodenum. The upper                            GI endoscopy was accomplished without difficulty.                            The patient tolerated the procedure well. Scope In: Scope Out: Findings:                 Patchy moderate inflammation characterized by                            congestion (edema), erosions, erythema, friability                            and granularity was found in the gastric body.                            Biopsies were taken with a cold forceps for                            histology. Verification of patient identification                            for the specimen was done. Estimated blood loss was                            minimal.                           moderate inflammation characterized by congestion                            (edema), erythema and friability and                            hyperpigmentation was found in the prepyloric                            region of the stomach. Biopsies were taken with a  cold forceps for histology. Estimated blood loss                            was minimal.                           Localized moderate inflammation characterized by                            congestion (edema) and erythema and hyperpigmented                            was found in the duodenal bulb. Biopsies were taken                            with a cold forceps for histology. Verification of                            patient identification for the specimen was done.                            Estimated blood loss was minimal.                           Diffuse atrophic mucosa was found in the second                            portion of the duodenum. Biopsies were taken with a                            cold forceps for histology. Verification of patient                            identification for the specimen was done. Estimated                            blood loss was minimal.                            The exam was otherwise without abnormality.                           The cardia and gastric fundus were normal on                            retroflexion. Complications:            No immediate complications. Estimated Blood Loss:     Estimated blood loss was minimal. Impression:               - Gastritis. Biopsied.                           - Gastritis. Biopsied.                           -  Duodenitis. Biopsied.                           - Duodenal mucosal atrophy. Biopsied.                           - The examination was otherwise normal. Recommendation:           - Patient has a contact number available for                            emergencies. The signs and symptoms of potential                            delayed complications were discussed with the                            patient. Return to normal activities tomorrow.                            Written discharge instructions were provided to the                            patient.                           - Resume previous diet.                           - Continue present medications.                           - Await pathology results.                           - See the other procedure note for documentation of                            additional recommendations. Gatha Mayer, MD 01/17/2019 3:39:43 PM This report has been signed electronically.

## 2019-01-17 NOTE — Op Note (Signed)
[name] Patient Name: Chad Bennett Procedure Date: 01/17/2019 3:00 PM MRN: [id number] Endoscopist: Gatha Mayer , MD Age: 48 Referring MD:  Date of Birth: [birth year] Gender: Male Account #: [id number] Procedure:                Colonoscopy Indications:              Generalized abdominal pain, Clinically significant                            diarrhea of unexplained origin, Hematochezia Medicines:                Propofol per Anesthesia, Monitored Anesthesia Care Procedure:                Pre-Anesthesia Assessment:                           - Prior to the procedure, a History and Physical                            was performed, and patient medications and                            allergies were reviewed. The patient's tolerance of                            previous anesthesia was also reviewed. The risks                            and benefits of the procedure and the sedation                            options and risks were discussed with the patient.                            All questions were answered, and informed consent                            was obtained. Prior Anticoagulants: The patient has                            taken no previous anticoagulant or antiplatelet                            agents. ASA Grade Assessment: II - A patient with                            mild systemic disease. After reviewing the risks                            and benefits, the patient was deemed in                            satisfactory condition to undergo the procedure.  After obtaining informed consent, the colonoscope                            was passed under direct vision. Throughout the                            procedure, the patient's blood pressure, pulse, and                            oxygen saturations were monitored continuously. The                            Colonoscope was introduced through the anus and                             advanced to the the terminal ileum, with                            identification of the appendiceal orifice and IC                            valve. The colonoscopy was performed without                            difficulty. The patient tolerated the procedure                            well. The quality of the bowel preparation was                            excellent. The terminal ileum, ileocecal valve,                            appendiceal orifice, and rectum were photographed.                            The bowel preparation used was Miralax via split                            dose instruction. Scope In: 3:15:55 PM Scope Out: 3:27:42 PM Scope Withdrawal Time: 0 hours 10 minutes 23 seconds  Total Procedure Duration: 0 hours 11 minutes 47 seconds  Findings:                 The perianal and digital rectal examinations were                            normal. Pertinent negatives include normal prostate                            (size, shape, and consistency).                           A diffuse area of mucosa in the terminal ileum was  nodular. Biopsies were taken with a cold forceps                            for histology. Verification of patient                            identification for the specimen was done. Estimated                            blood loss was minimal.                           Multiple small-mouthed diverticula were found in                            the sigmoid colon.                           The exam was otherwise without abnormality on                            direct and retroflexion views.                           Biopsies for histology were taken with a cold                            forceps from the right colon and left colon for                            evaluation of microscopic colitis. Complications:            No immediate complications. Estimated Blood Loss:     Estimated blood loss was  minimal. Impression:               - Nodular ileal mucosa. Biopsied.                           - Diverticulosis in the sigmoid colon.                           - The examination was otherwise normal on direct                            and retroflexion views.                           - Biopsies were taken with a cold forceps from the                            right colon and left colon for evaluation of                            microscopic colitis. Recommendation:           - Patient has a contact number available for  emergencies. The signs and symptoms of potential                            delayed complications were discussed with the                            patient. Return to normal activities tomorrow.                            Written discharge instructions were provided to the                            patient.                           - Resume previous diet.                           - Continue present medications.                           - Repeat colonoscopy is recommended. The                            colonoscopy date will be determined after pathology                            results from today's exam become available for                            review. Gatha Mayer, MD 01/17/2019 3:42:28 PM This report has been signed electronically.

## 2019-01-17 NOTE — Progress Notes (Signed)
Called to room to assist during endoscopic procedure.  Patient ID and intended procedure confirmed with present staff. Received instructions for my participation in the procedure from the performing physician.  

## 2019-01-17 NOTE — Patient Instructions (Addendum)
The stomach and duodenal bulb were inflamed and there was some hyperpigmentation.   Dark mucosa - looks like iron pigment incorporation - ? On in past.  The duodenum itself was possibly a bit atrophic.  There was nodular terminal ileal mucosa - and normal colon.  Biopsies of all taken and I will let you know.  You could start a PPI if desired - I do not think urgent and often wait for pathology results first.  I appreciate the opportunity to care for you. Gatha Mayer, MD, FACG  YOU HAD AN ENDOSCOPIC PROCEDURE TODAY AT [facility]:   Refer to the procedure report that was given to you for any specific questions about what was found during the examination.  If the procedure report does not answer your questions, please call your gastroenterologist to clarify.  If you requested that your care partner not be given the details of your procedure findings, then the procedure report has been included in a sealed envelope for you to review at your convenience later.  YOU SHOULD EXPECT: Some feelings of bloating in the abdomen. Passage of more gas than usual.  Walking can help get rid of the air that was put into your GI tract during the procedure and reduce the bloating. If you had a lower endoscopy (such as a colonoscopy or flexible sigmoidoscopy) you may notice spotting of blood in your stool or on the toilet paper. If you underwent a bowel prep for your procedure, you may not have a normal bowel movement for a few days.  Please Note:  You might notice some irritation and congestion in your nose or some drainage.  This is from the oxygen used during your procedure.  There is no need for concern and it should clear up in a day or so.  SYMPTOMS TO REPORT IMMEDIATELY:   Following lower endoscopy (colonoscopy or flexible sigmoidoscopy):  Excessive amounts of blood in the stool  Significant tenderness or worsening of abdominal pains  Swelling of the abdomen that is new,  acute  Fever of 100F or higher   Following upper endoscopy (EGD)  Vomiting of blood or coffee ground material  New chest pain or pain under the shoulder blades  Painful or persistently difficult swallowing  New shortness of breath  Fever of 100F or higher  Black, tarry-looking stools  For urgent or emergent issues, a gastroenterologist can be reached at any hour by calling [phone].   DIET:  We do recommend a small meal at first, but then you may proceed to your regular diet.  Drink plenty of fluids but you should avoid alcoholic beverages for 24 hours.  ACTIVITY:  You should plan to take it easy for the rest of today and you should NOT DRIVE or use heavy machinery until tomorrow (because of the sedation medicines used during the test).    FOLLOW UP: Our staff will call the number listed on your records 48-72 hours following your procedure to check on you and address any questions or concerns that you may have regarding the information given to you following your procedure. If we do not reach you, we will leave a message.  We will attempt to reach you two times.  During this call, we will ask if you have developed any symptoms of COVID 19. If you develop any symptoms (ie: fever, flu-like symptoms, shortness of breath, cough etc.) before then, please call [phone].  If you test positive for Covid 19 in the 2  weeks post procedure, please call and report this information to [place].    If any biopsies were taken you will be contacted by phone or by letter within the next 1-3 weeks.  Please call us at [phone] if you have not heard about the biopsies in 3 weeks.    SIGNATURES/CONFIDENTIALITY: You and/or your care partner have signed paperwork which will be entered into your electronic medical record.  These signatures attest to the fact that that the information above on your After Visit Summary has been reviewed and is understood.  Full responsibility of the confidentiality of  this discharge information lies with you and/or your care-partner.

## 2019-01-19 ENCOUNTER — Telehealth: Payer: Self-pay

## 2019-01-19 NOTE — Telephone Encounter (Signed)
Second post procedure follow up call, no answer

## 2019-01-19 NOTE — Telephone Encounter (Signed)
Attempted to reach patient for post-procedure f/u call. No answer. Left message that we will make another attempt to reach him again later today and for him to please not hesitate to call us if he has any questions/concerns regarding his care.

## 2019-01-21 ENCOUNTER — Encounter: Payer: Self-pay | Admitting: Internal Medicine

## 2019-01-21 NOTE — Progress Notes (Signed)
Recall colonoscopy 10 years  No EGD recall  My Chart message to patient via letter

## 2019-03-17 MED FILL — SYNTHROID 137 MCG TABLET: 137 | 90 days supply | Qty: 90 | Fill #0

## 2019-06-30 MED FILL — SYNTHROID 137 MCG TABLET: 137 | 90 days supply | Qty: 90 | Fill #1

## 2019-07-07 DIAGNOSIS — E039 Hypothyroidism, unspecified: Secondary | ICD-10-CM | POA: Diagnosis not present

## 2019-07-21 DIAGNOSIS — L130 Dermatitis herpetiformis: Secondary | ICD-10-CM | POA: Diagnosis not present

## 2019-07-21 DIAGNOSIS — E039 Hypothyroidism, unspecified: Secondary | ICD-10-CM | POA: Diagnosis not present

## 2019-07-21 MED FILL — SYNTHROID 150 MCG TABLET: 150 | 90 days supply | Qty: 90 | Fill #0

## 2019-08-14 IMAGING — CR DG CHEST 2V
2 series · 2 of 2 positions shown · non-contrast
Comparison: None.

CLINICAL DATA: Cough, congestion and fevers.

EXAM:
CHEST - 2 VIEW

[w chest pa]
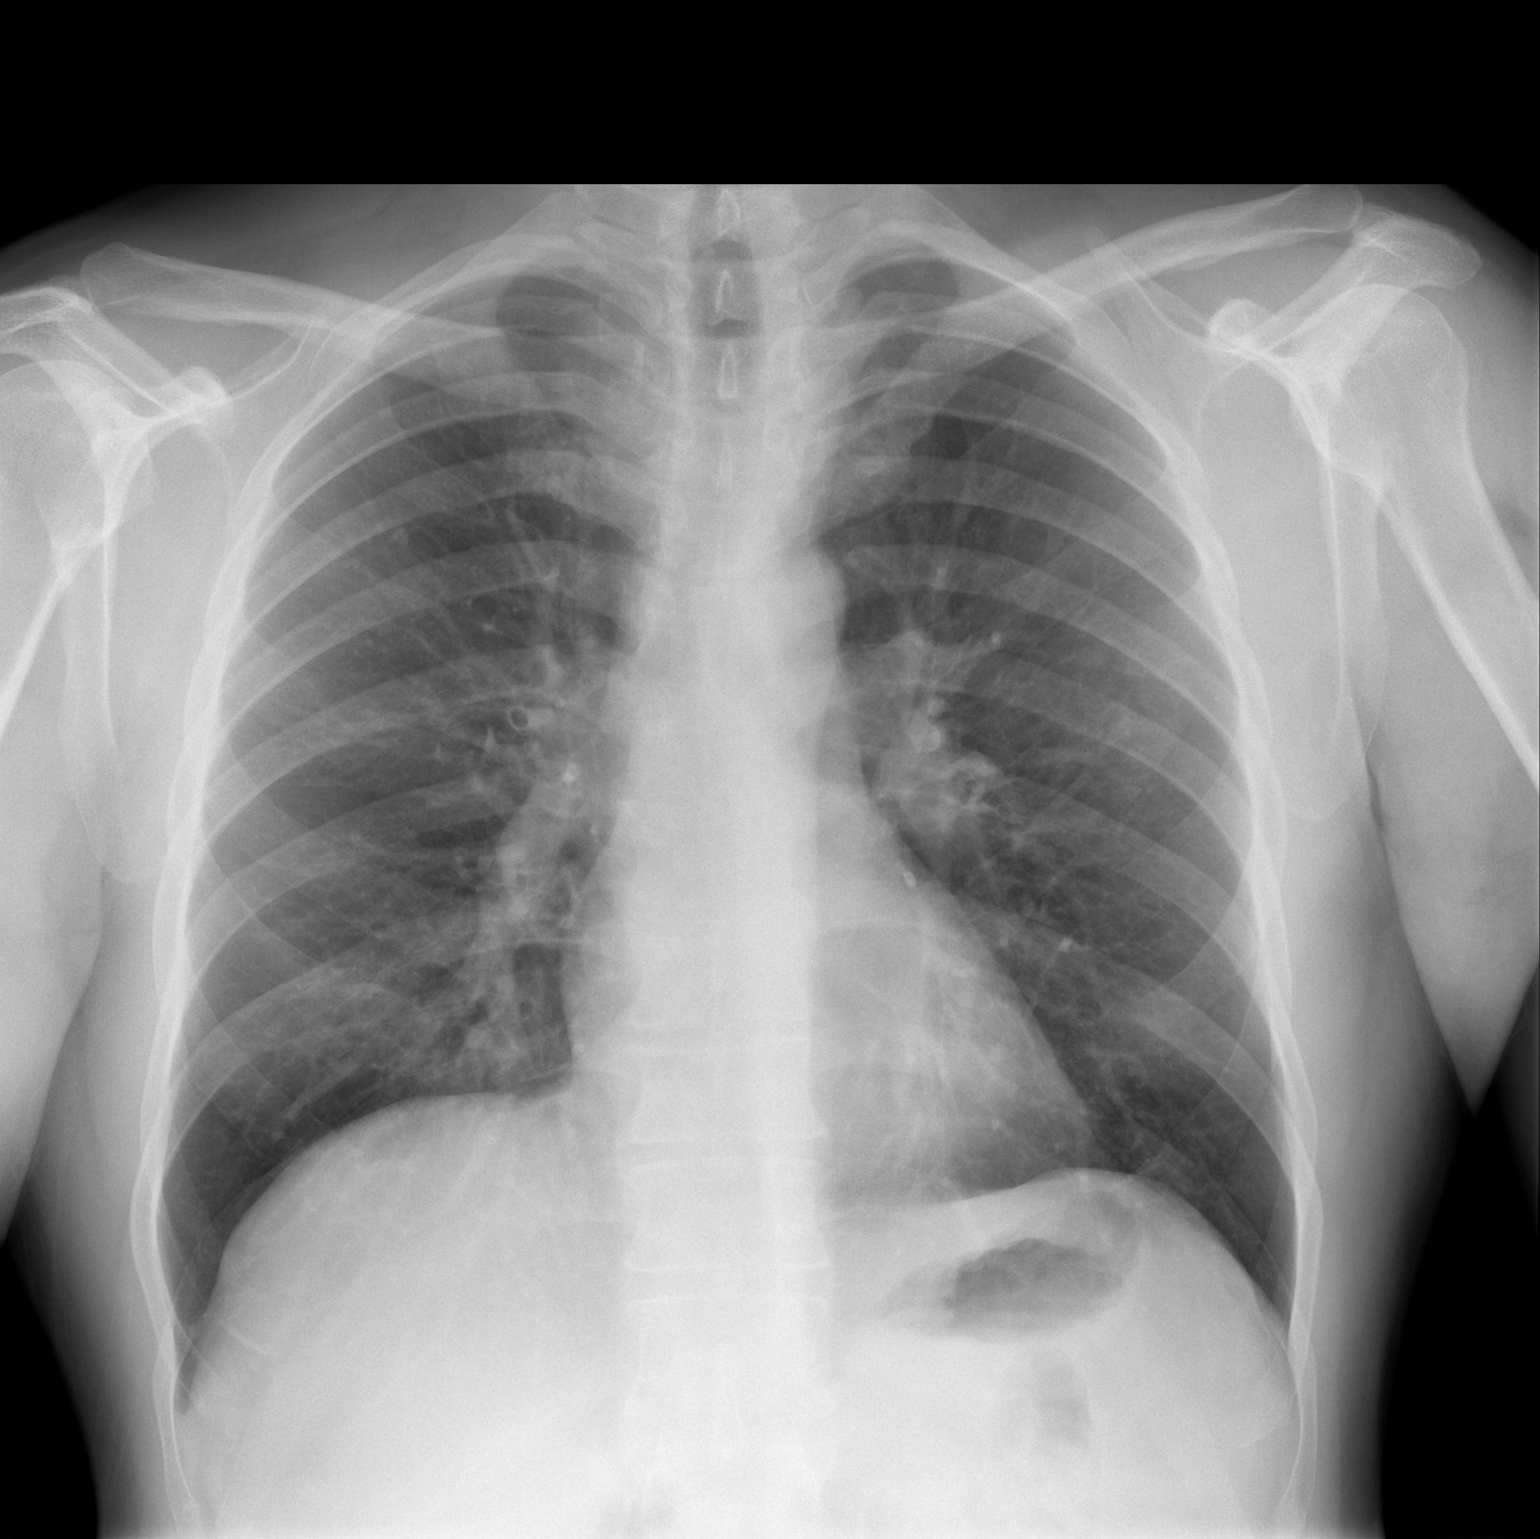

[w chest lat]
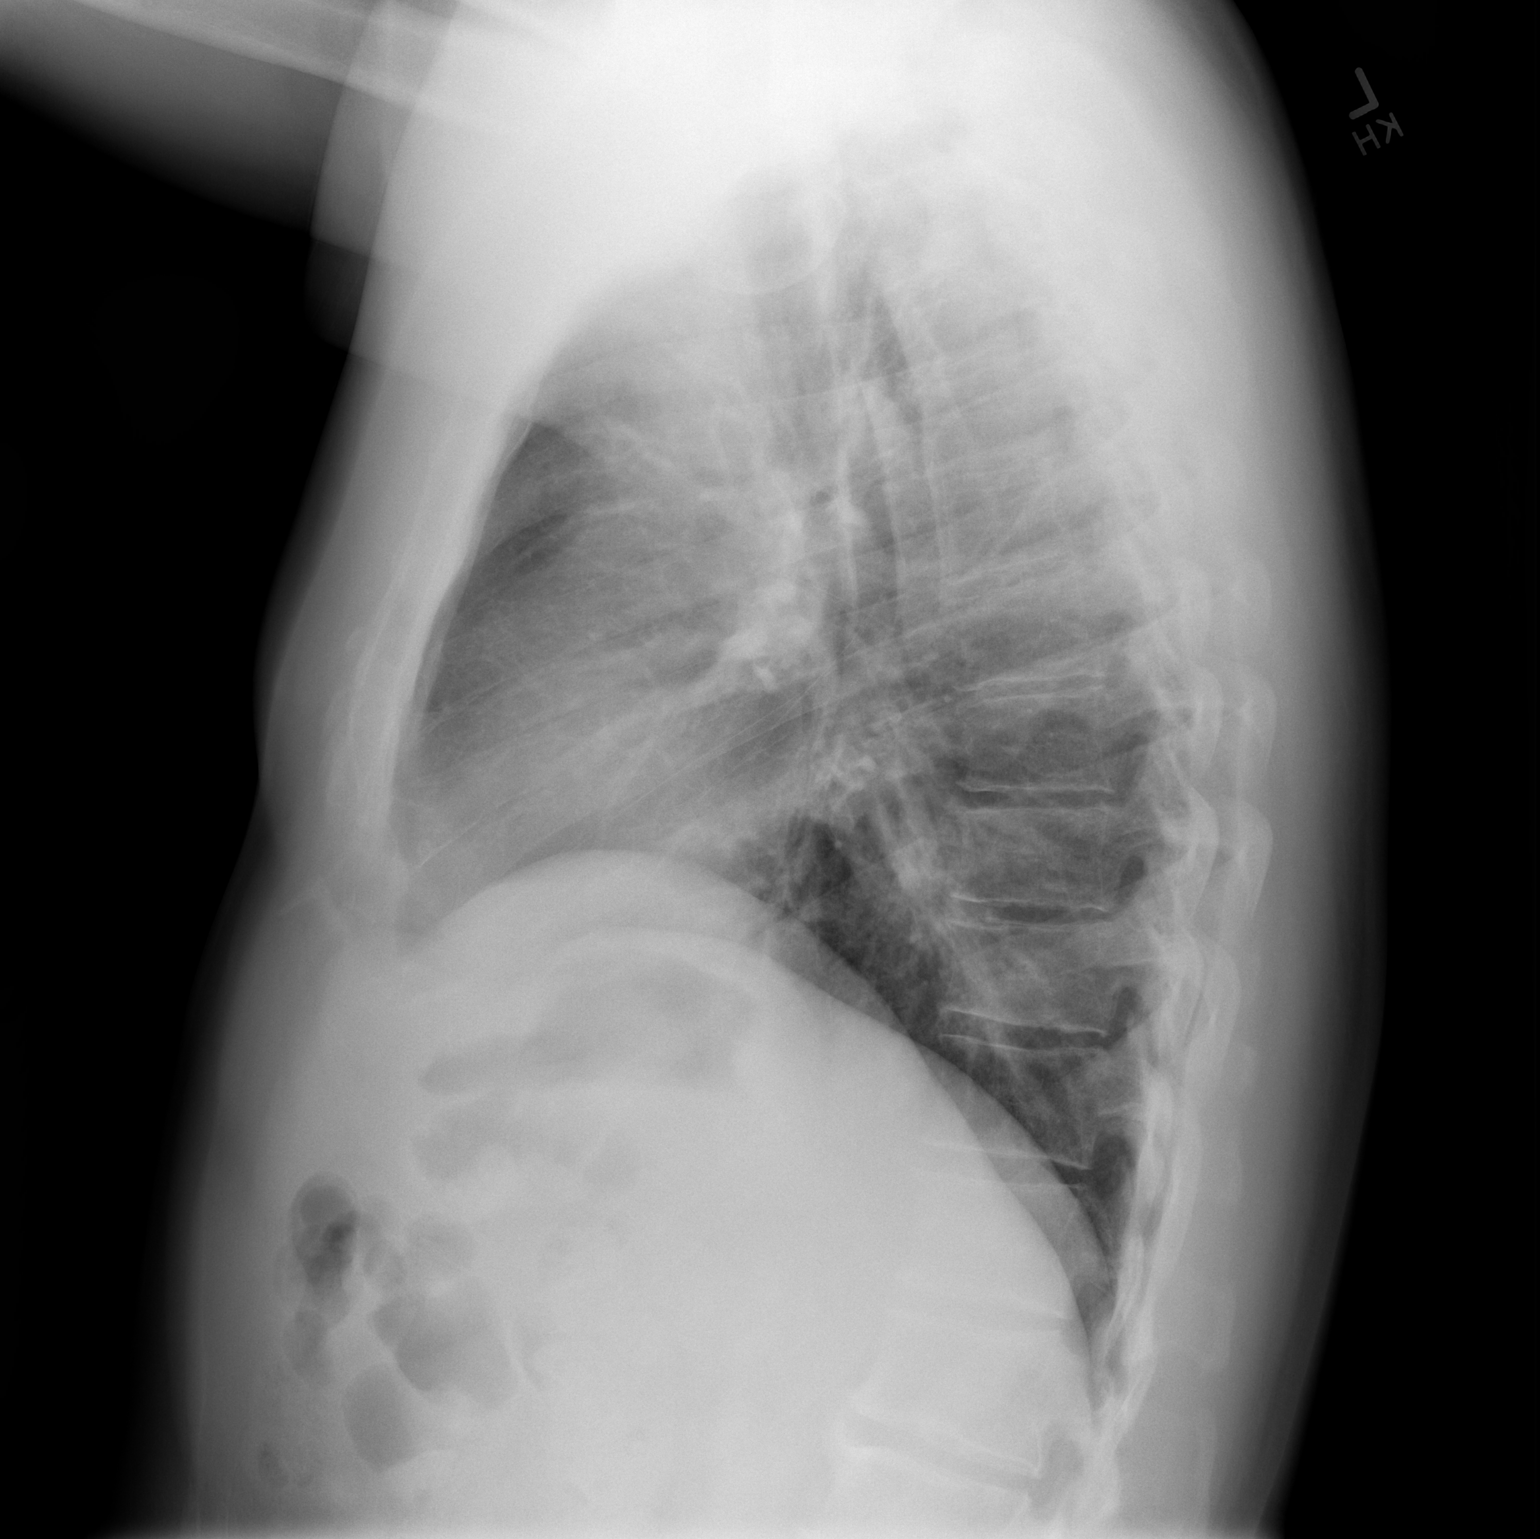

[2 of 2 positions shown; findings below may reference images not displayed]

FINDINGS: The heart size and mediastinal contours are within normal limits.
Both lungs are clear. The visualized skeletal structures are
unremarkable.
IMPRESSION: No active cardiopulmonary disease.

## 2019-09-13 DIAGNOSIS — E039 Hypothyroidism, unspecified: Secondary | ICD-10-CM | POA: Diagnosis not present

## 2019-09-14 ENCOUNTER — Other Ambulatory Visit (HOSPITAL_COMMUNITY): Payer: Self-pay | Admitting: Internal Medicine

## 2019-11-08 DIAGNOSIS — E039 Hypothyroidism, unspecified: Secondary | ICD-10-CM | POA: Diagnosis not present

## 2019-11-16 DIAGNOSIS — Z3009 Encounter for other general counseling and advice on contraception: Secondary | ICD-10-CM | POA: Diagnosis not present

## 2019-11-16 MED FILL — diazePAM 5 MG TABS: 5 | 1 days supply | Qty: 3 | Fill #0

## 2019-12-09 MED FILL — SYNTHROID 150 MCG TABLET: 150 | 90 days supply | Qty: 45 | Fill #0

## 2019-12-09 MED FILL — SYNTHROID 137 MCG TABLET: 137 | 90 days supply | Qty: 45 | Fill #0

## 2019-12-21 IMAGING — CT CT HEART SCORING
2 series · 16 of 20 positions shown, 18 images · non-contrast
Comparison: None.

CLINICAL DATA: Risk stratification

EXAM:
Coronary Calcium Score
TECHNIQUE: The patient was scanned on a Siemens Somatom 64 slice scanner. Axial
non-contrast 3 mm slices were carried out through the heart. The
data set was analyzed on a dedicated work station and scored using
the Agatson method.

[Series 2: casc 3.0 i36f 2 bestdiast 63 % · axial · 0.41mm/px · z∈[-280,-175]mm · 8 of 45 slices shown, 10 images]
[im 5/45  vessel]
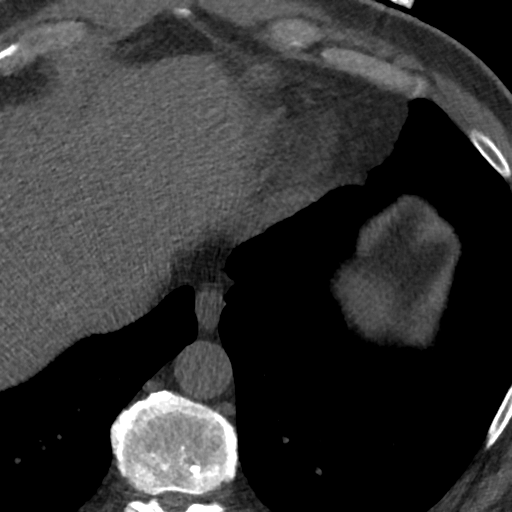
[im 5/45  lung]
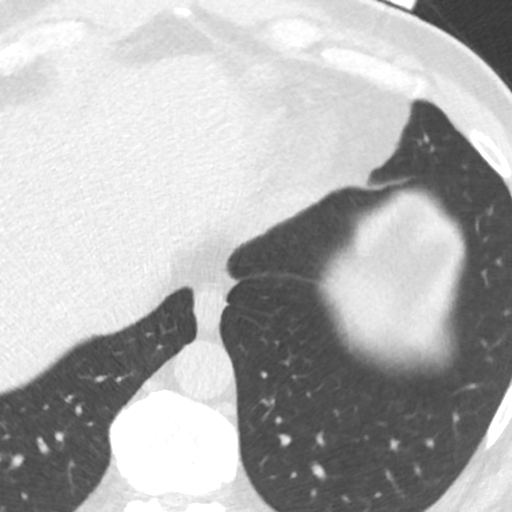
[im 10/45  vessel]
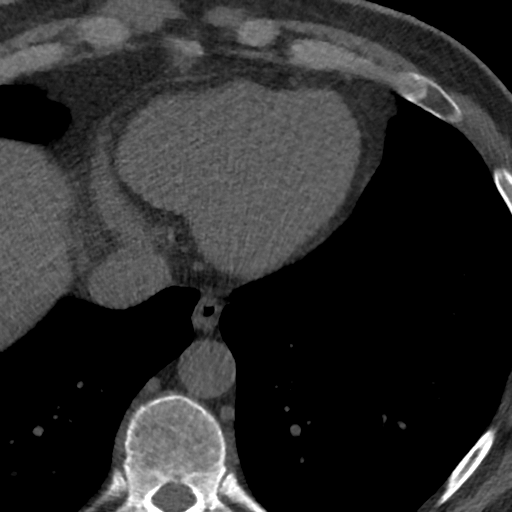
[im 15/45  vessel]
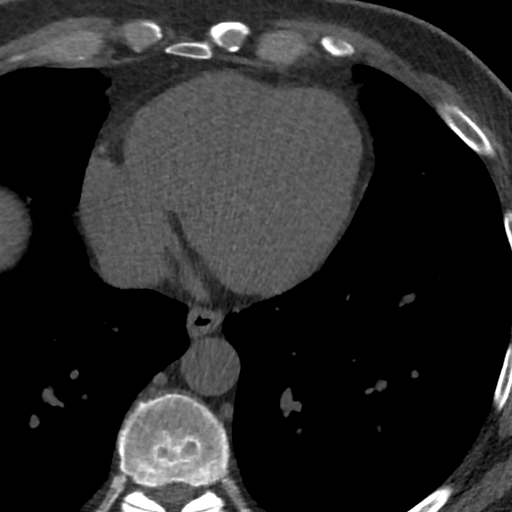
[im 20/45  vessel]
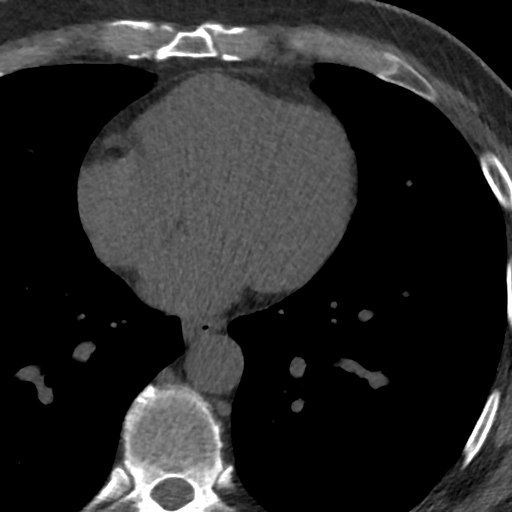
[im 25/45  vessel]
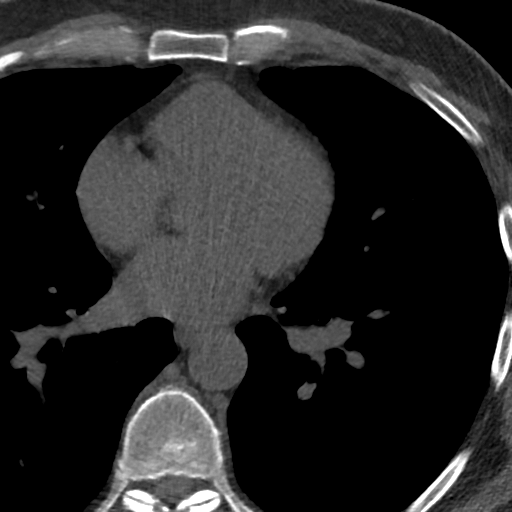
[im 25/45  lung]
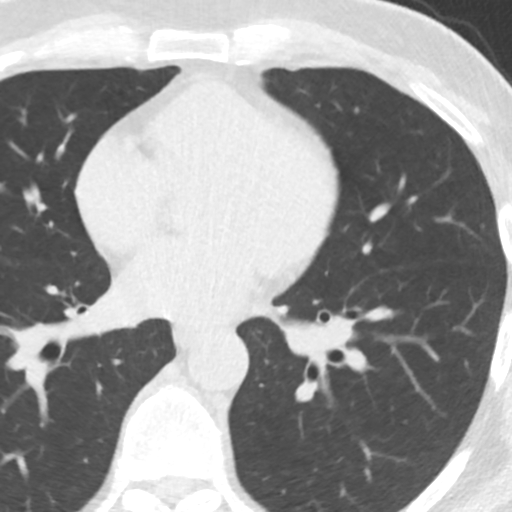
[im 30/45  vessel]
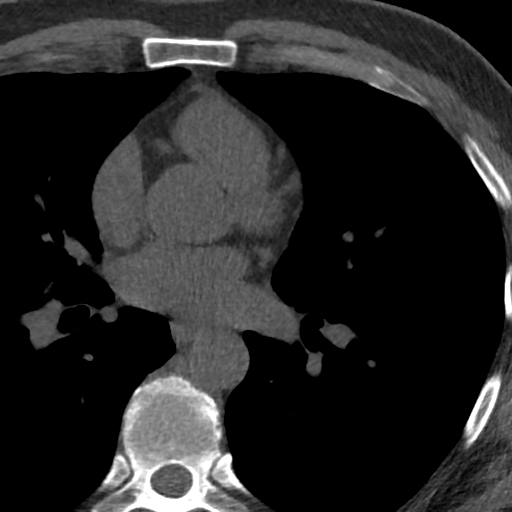
[im 35/45  vessel]
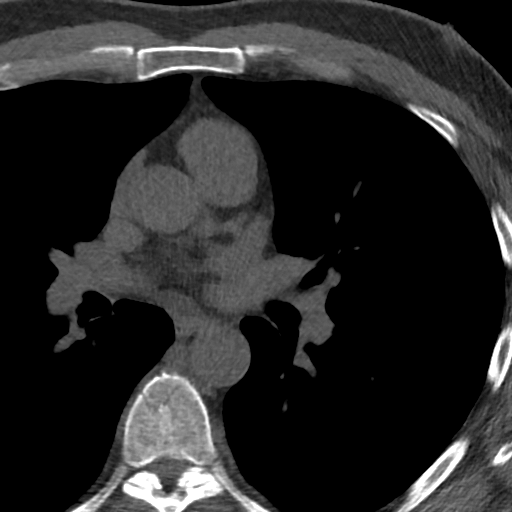
[im 40/45  vessel]
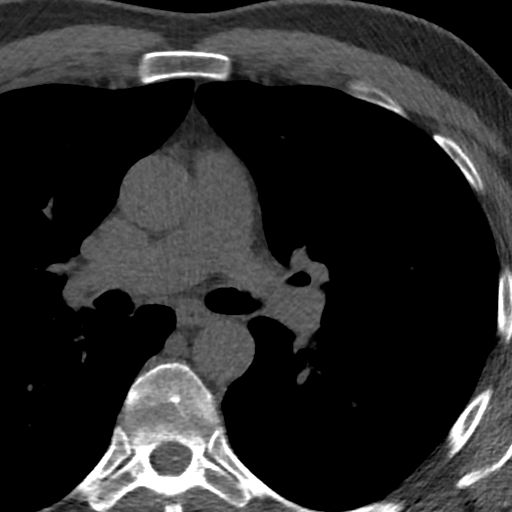

[Series 4: lung st 66 % · axial · 0.70mm/px · z∈[-280,-174]mm · 8 of 45 slices shown]
[im 5/45  lung]
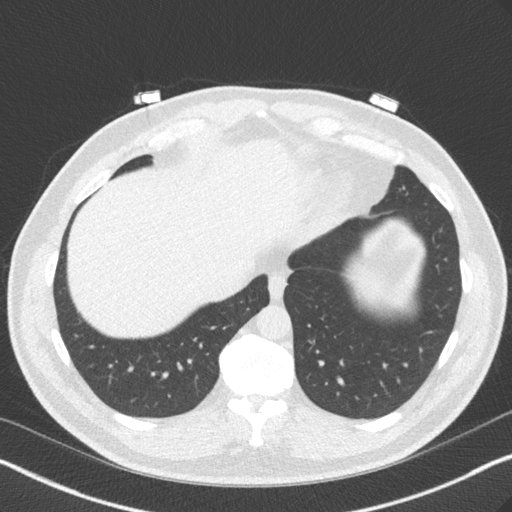
[im 10/45  lung]
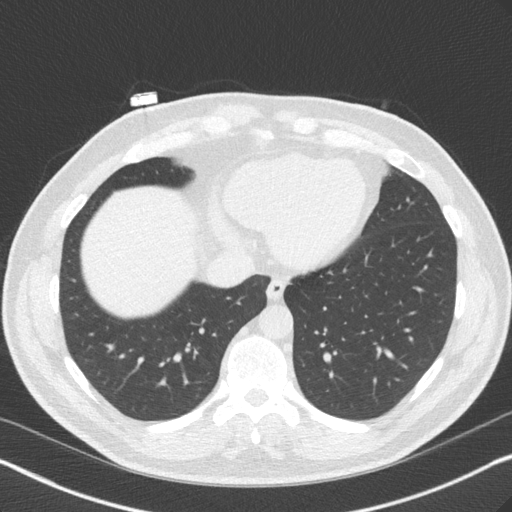
[im 15/45  lung]
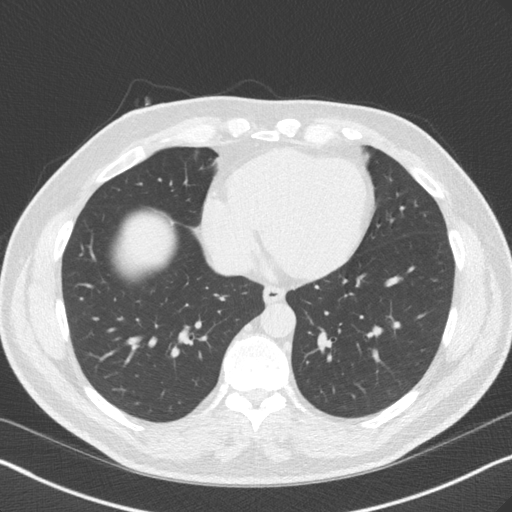
[im 20/45  lung]
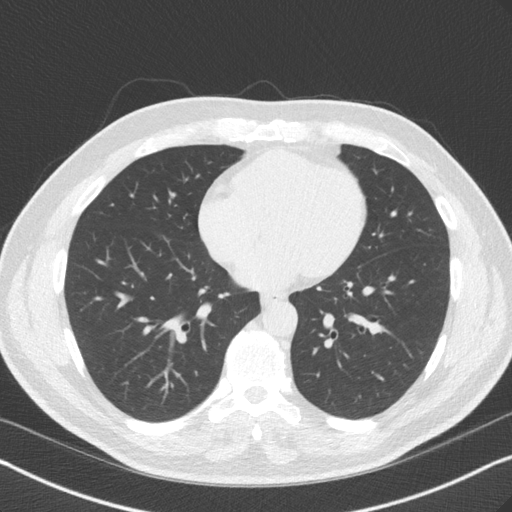
[im 25/45  lung]
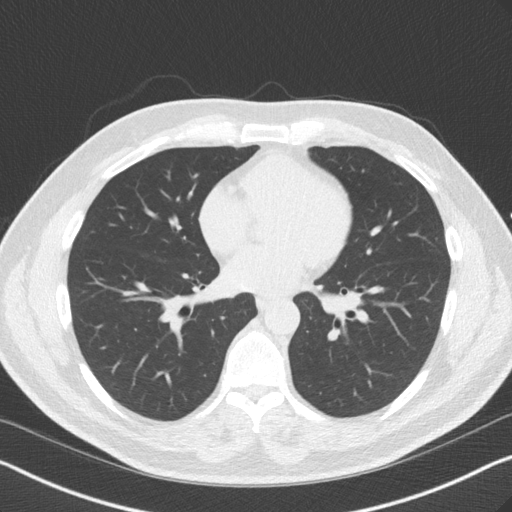
[im 30/45  lung]
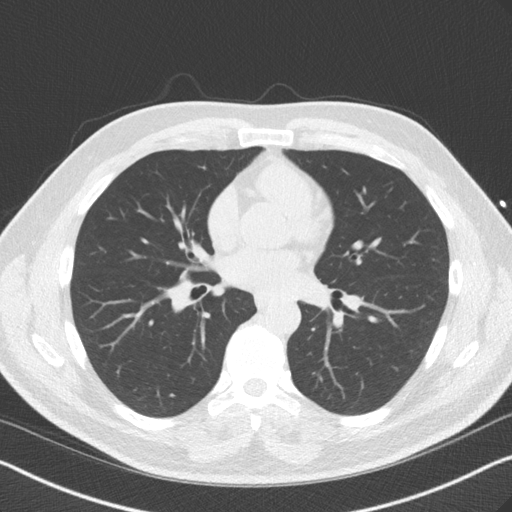
[im 35/45  lung]
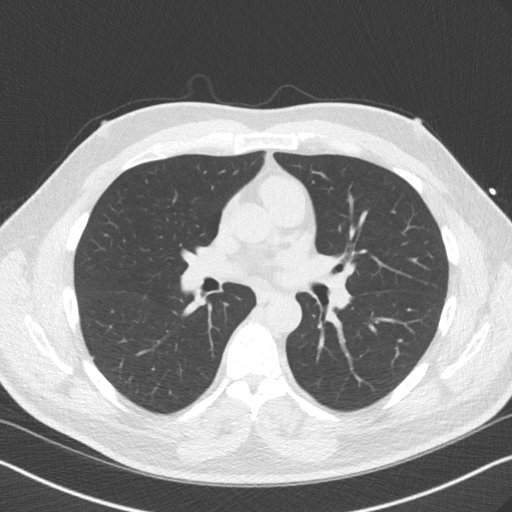
[im 40/45  lung]
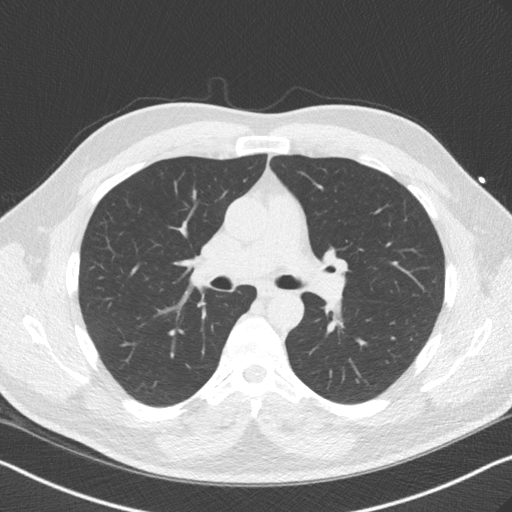

[16 of 20 positions shown; findings below may reference images not displayed]

FINDINGS: Non-cardiac: See separate report from [REDACTED].

Ascending Aorta: Normal diameter 3.1 cm

Pericardium: Normal

Coronary arteries: No calcium detected
IMPRESSION: Coronary calcium score of 0.

Berhnu Nasiphi

EXAM:
OVER-READ INTERPRETATION  CT CHEST

The following report is an over-read performed by radiologist Dr.
Taurean Cerrato [REDACTED] on 10/21/2017. This
over-read does not include interpretation of cardiac or coronary
anatomy or pathology. The coronary calcium score interpretation by
the cardiologist is attached.
FINDINGS: Limited view of the lung parenchyma demonstrates no suspicious
nodularity. Airways are normal.

Limited view of the mediastinum demonstrates no adenopathy.
Esophagus normal.

Limited view of the upper abdomen unremarkable.

Limited view of the skeleton and chest wall is unremarkable.
IMPRESSION: No significant extracardiac findings.

## 2020-01-12 DIAGNOSIS — Z302 Encounter for sterilization: Secondary | ICD-10-CM | POA: Diagnosis not present

## 2020-02-20 DIAGNOSIS — Z20822 Contact with and (suspected) exposure to covid-19: Secondary | ICD-10-CM | POA: Diagnosis not present

## 2020-02-29 MED FILL — SYNTHROID 150 MCG TABLET: 150 | 90 days supply | Qty: 45 | Fill #1

## 2020-02-29 MED FILL — SYNTHROID 137 MCG TABLET: 137 | 90 days supply | Qty: 45 | Fill #1

## 2020-07-09 ENCOUNTER — Other Ambulatory Visit (HOSPITAL_COMMUNITY): Payer: Self-pay | Admitting: Internal Medicine

## 2020-07-10 ENCOUNTER — Other Ambulatory Visit (HOSPITAL_COMMUNITY): Payer: Self-pay | Admitting: Internal Medicine

## 2020-07-10 ENCOUNTER — Other Ambulatory Visit (HOSPITAL_COMMUNITY): Payer: Self-pay

## 2020-07-13 ENCOUNTER — Other Ambulatory Visit (HOSPITAL_COMMUNITY): Payer: Self-pay

## 2020-07-13 ENCOUNTER — Other Ambulatory Visit (HOSPITAL_COMMUNITY): Payer: Self-pay | Admitting: Internal Medicine

## 2020-07-18 ENCOUNTER — Other Ambulatory Visit (HOSPITAL_COMMUNITY): Payer: Self-pay | Admitting: Internal Medicine

## 2020-07-18 ENCOUNTER — Other Ambulatory Visit (HOSPITAL_COMMUNITY): Payer: Self-pay

## 2020-07-26 DIAGNOSIS — E039 Hypothyroidism, unspecified: Secondary | ICD-10-CM | POA: Diagnosis not present

## 2020-07-27 ENCOUNTER — Other Ambulatory Visit (HOSPITAL_COMMUNITY): Payer: Self-pay

## 2020-07-27 DIAGNOSIS — L130 Dermatitis herpetiformis: Secondary | ICD-10-CM | POA: Diagnosis not present

## 2020-07-27 DIAGNOSIS — E039 Hypothyroidism, unspecified: Secondary | ICD-10-CM | POA: Diagnosis not present

## 2020-07-27 MED ORDER — SYNTHROID 150 MCG PO TABS
150.0000 ug | ORAL_TABLET | Freq: Every day | ORAL | 4 refills | Status: DC
  Filled 2020-07-27: qty 90, 90d supply, fill #0
  Filled 2020-10-23: qty 90, 90d supply, fill #1
  Filled 2021-02-04: qty 90, 90d supply, fill #2
  Filled 2021-05-23: qty 90, 90d supply, fill #3

## 2020-07-30 ENCOUNTER — Other Ambulatory Visit (HOSPITAL_COMMUNITY): Payer: Self-pay

## 2020-09-07 DIAGNOSIS — E039 Hypothyroidism, unspecified: Secondary | ICD-10-CM | POA: Diagnosis not present

## 2020-10-24 ENCOUNTER — Other Ambulatory Visit (HOSPITAL_COMMUNITY): Payer: Self-pay

## 2021-02-04 ENCOUNTER — Other Ambulatory Visit (HOSPITAL_COMMUNITY): Payer: Self-pay

## 2021-05-23 ENCOUNTER — Other Ambulatory Visit (HOSPITAL_COMMUNITY): Payer: Self-pay

## 2021-07-16 DIAGNOSIS — E039 Hypothyroidism, unspecified: Secondary | ICD-10-CM | POA: Diagnosis not present

## 2021-07-23 ENCOUNTER — Other Ambulatory Visit (HOSPITAL_COMMUNITY): Payer: Self-pay

## 2021-07-23 DIAGNOSIS — L130 Dermatitis herpetiformis: Secondary | ICD-10-CM | POA: Diagnosis not present

## 2021-07-23 DIAGNOSIS — E039 Hypothyroidism, unspecified: Secondary | ICD-10-CM | POA: Diagnosis not present

## 2021-07-23 MED ORDER — SYNTHROID 150 MCG PO TABS
150.0000 ug | ORAL_TABLET | Freq: Every morning | ORAL | 4 refills | Status: DC
  Filled 2021-07-23 – 2021-12-10 (×2): qty 90, 90d supply, fill #0
  Filled 2022-03-05: qty 90, 90d supply, fill #1
  Filled 2022-05-23: qty 90, 90d supply, fill #2

## 2021-09-04 ENCOUNTER — Other Ambulatory Visit (HOSPITAL_COMMUNITY): Payer: Self-pay

## 2021-09-05 ENCOUNTER — Other Ambulatory Visit (HOSPITAL_COMMUNITY): Payer: Self-pay

## 2021-09-05 MED ORDER — SYNTHROID 150 MCG PO TABS
150.0000 ug | ORAL_TABLET | Freq: Every morning | ORAL | 3 refills | Status: AC
  Filled 2021-09-05: qty 90, 90d supply, fill #0
  Filled 2022-05-18 – 2022-08-27 (×2): qty 90, 90d supply, fill #1

## 2021-12-10 ENCOUNTER — Other Ambulatory Visit (HOSPITAL_COMMUNITY): Payer: Self-pay

## 2022-03-06 ENCOUNTER — Other Ambulatory Visit: Payer: Self-pay

## 2022-05-19 ENCOUNTER — Other Ambulatory Visit: Payer: Self-pay

## 2022-05-24 ENCOUNTER — Other Ambulatory Visit (HOSPITAL_COMMUNITY): Payer: Self-pay

## 2022-09-02 ENCOUNTER — Other Ambulatory Visit (HOSPITAL_COMMUNITY): Payer: Self-pay

## 2022-09-04 ENCOUNTER — Other Ambulatory Visit: Payer: Self-pay

## 2022-09-04 ENCOUNTER — Other Ambulatory Visit (HOSPITAL_COMMUNITY): Payer: Self-pay

## 2022-09-04 MED ORDER — SYNTHROID 150 MCG PO TABS
150.0000 ug | ORAL_TABLET | Freq: Every morning | ORAL | 0 refills | Status: DC
  Filled 2022-09-04 – 2022-11-29 (×2): qty 90, 90d supply, fill #0

## 2022-11-29 ENCOUNTER — Other Ambulatory Visit (HOSPITAL_COMMUNITY): Payer: Self-pay

## 2022-12-01 ENCOUNTER — Other Ambulatory Visit: Payer: Self-pay

## 2022-12-01 ENCOUNTER — Other Ambulatory Visit (HOSPITAL_COMMUNITY): Payer: Self-pay

## 2023-02-06 ENCOUNTER — Other Ambulatory Visit (HOSPITAL_COMMUNITY): Payer: Self-pay

## 2023-02-06 MED ORDER — TETANUS-DIPHTH-ACELL PERTUSSIS 5-2.5-18.5 LF-MCG/0.5 IM SUSY
0.5000 mL | PREFILLED_SYRINGE | Freq: Once | INTRAMUSCULAR | 0 refills | Status: AC
  Filled 2023-02-06: qty 0.5, 1d supply, fill #0

## 2023-03-04 ENCOUNTER — Other Ambulatory Visit (HOSPITAL_COMMUNITY): Payer: Self-pay

## 2023-03-05 ENCOUNTER — Other Ambulatory Visit (HOSPITAL_COMMUNITY): Payer: Self-pay

## 2023-03-05 MED ORDER — LEVOTHYROXINE SODIUM 150 MCG PO TABS
ORAL_TABLET | ORAL | 0 refills | Status: DC
  Filled 2023-03-05 (×2): qty 90, 90d supply, fill #0

## 2023-03-06 ENCOUNTER — Other Ambulatory Visit: Payer: Self-pay

## 2023-03-06 ENCOUNTER — Other Ambulatory Visit (HOSPITAL_COMMUNITY): Payer: Self-pay

## 2023-03-14 ENCOUNTER — Other Ambulatory Visit (HOSPITAL_COMMUNITY): Payer: Self-pay

## 2023-06-17 ENCOUNTER — Other Ambulatory Visit (HOSPITAL_COMMUNITY): Payer: Self-pay

## 2023-06-17 ENCOUNTER — Other Ambulatory Visit: Payer: Self-pay

## 2023-06-17 DIAGNOSIS — E039 Hypothyroidism, unspecified: Secondary | ICD-10-CM | POA: Diagnosis not present

## 2023-06-17 DIAGNOSIS — L130 Dermatitis herpetiformis: Secondary | ICD-10-CM | POA: Diagnosis not present

## 2023-06-17 MED ORDER — SYNTHROID 150 MCG PO TABS
150.0000 ug | ORAL_TABLET | Freq: Every morning | ORAL | 4 refills | Status: AC
  Filled 2023-06-17: qty 90, 90d supply, fill #0
  Filled 2023-09-12: qty 90, 90d supply, fill #1

## 2023-06-17 MED ORDER — SYNTHROID 150 MCG PO TABS
150.0000 ug | ORAL_TABLET | Freq: Every morning | ORAL | 3 refills | Status: AC
  Filled 2023-06-17 – 2023-12-14 (×2): qty 90, 90d supply, fill #0
  Filled 2024-03-21: qty 90, 90d supply, fill #1

## 2023-09-12 ENCOUNTER — Other Ambulatory Visit (HOSPITAL_COMMUNITY): Payer: Self-pay

## 2023-09-14 ENCOUNTER — Other Ambulatory Visit: Payer: Self-pay

## 2023-09-30 DIAGNOSIS — E039 Hypothyroidism, unspecified: Secondary | ICD-10-CM | POA: Diagnosis not present

## 2023-12-15 ENCOUNTER — Other Ambulatory Visit (HOSPITAL_COMMUNITY): Payer: Self-pay

## 2023-12-15 ENCOUNTER — Encounter: Payer: Self-pay | Admitting: Pharmacist

## 2023-12-15 ENCOUNTER — Other Ambulatory Visit: Payer: Self-pay

## 2024-01-12 ENCOUNTER — Other Ambulatory Visit (HOSPITAL_COMMUNITY): Payer: Self-pay

## 2024-01-12 MED ORDER — FLUZONE 0.5 ML IM SUSY
0.5000 mL | PREFILLED_SYRINGE | Freq: Once | INTRAMUSCULAR | 0 refills | Status: AC
  Filled 2024-01-12: qty 0.5, 1d supply, fill #0

## 2024-02-10 ENCOUNTER — Other Ambulatory Visit (HOSPITAL_COMMUNITY): Payer: Self-pay

## 2024-03-21 ENCOUNTER — Other Ambulatory Visit (HOSPITAL_COMMUNITY): Payer: Self-pay

## 2024-03-23 ENCOUNTER — Other Ambulatory Visit: Payer: Self-pay
